# Patient Record
Sex: Male | Born: 1990 | Race: Black or African American | Hispanic: No | Marital: Single | Smoking: Former smoker
Health system: Southern US, Community
[De-identification: ages and names within clinical notes are randomized; demographics above are authoritative.]

## PROBLEM LIST (undated history)

## (undated) ENCOUNTER — Ambulatory Visit: Admission: EM | Payer: Medicaid Other | Source: Home / Self Care

## (undated) ENCOUNTER — Emergency Department (HOSPITAL_COMMUNITY): Admission: EM | Payer: Self-pay | Source: Home / Self Care

## (undated) ENCOUNTER — Ambulatory Visit

## (undated) DIAGNOSIS — F419 Anxiety disorder, unspecified: Secondary | ICD-10-CM

## (undated) DIAGNOSIS — S82892A Other fracture of left lower leg, initial encounter for closed fracture: Secondary | ICD-10-CM

## (undated) DIAGNOSIS — H538 Other visual disturbances: Secondary | ICD-10-CM

## (undated) DIAGNOSIS — F909 Attention-deficit hyperactivity disorder, unspecified type: Secondary | ICD-10-CM

---
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
## 2008-02-15 ENCOUNTER — Emergency Department (HOSPITAL_COMMUNITY): Admission: EM | Admit: 2008-02-15 | Discharge: 2008-02-15 | Payer: Self-pay | Admitting: Emergency Medicine

## 2008-04-18 ENCOUNTER — Emergency Department (HOSPITAL_COMMUNITY): Admission: EM | Admit: 2008-04-18 | Discharge: 2008-04-18 | Payer: Self-pay | Admitting: Emergency Medicine

## 2008-04-22 ENCOUNTER — Emergency Department (HOSPITAL_COMMUNITY): Admission: EM | Admit: 2008-04-22 | Discharge: 2008-04-22 | Payer: Self-pay | Admitting: Emergency Medicine

## 2008-05-16 ENCOUNTER — Emergency Department (HOSPITAL_COMMUNITY): Admission: EM | Admit: 2008-05-16 | Discharge: 2008-05-16 | Payer: Self-pay | Admitting: Emergency Medicine

## 2008-06-13 ENCOUNTER — Emergency Department (HOSPITAL_COMMUNITY): Admission: EM | Admit: 2008-06-13 | Discharge: 2008-06-13 | Payer: Self-pay | Admitting: Emergency Medicine

## 2008-07-30 ENCOUNTER — Emergency Department (HOSPITAL_COMMUNITY): Admission: EM | Admit: 2008-07-30 | Discharge: 2008-07-31 | Payer: Self-pay | Admitting: Emergency Medicine

## 2008-08-06 ENCOUNTER — Emergency Department (HOSPITAL_COMMUNITY): Admission: EM | Admit: 2008-08-06 | Discharge: 2008-08-06 | Payer: Self-pay | Admitting: Family Medicine

## 2008-08-31 ENCOUNTER — Emergency Department (HOSPITAL_COMMUNITY): Admission: EM | Admit: 2008-08-31 | Discharge: 2008-08-31 | Payer: Self-pay | Admitting: Emergency Medicine

## 2009-01-23 ENCOUNTER — Emergency Department (HOSPITAL_COMMUNITY): Admission: EM | Admit: 2009-01-23 | Discharge: 2009-01-23 | Payer: Self-pay | Admitting: Family Medicine

## 2009-01-31 ENCOUNTER — Emergency Department (HOSPITAL_COMMUNITY): Admission: EM | Admit: 2009-01-31 | Discharge: 2009-01-31 | Payer: Self-pay | Admitting: Family Medicine

## 2009-03-06 ENCOUNTER — Emergency Department (HOSPITAL_COMMUNITY): Admission: EM | Admit: 2009-03-06 | Discharge: 2009-03-06 | Payer: Self-pay | Admitting: Emergency Medicine

## 2009-07-29 ENCOUNTER — Emergency Department (HOSPITAL_COMMUNITY): Admission: EM | Admit: 2009-07-29 | Discharge: 2009-07-30 | Payer: Self-pay | Admitting: Emergency Medicine

## 2011-02-23 ENCOUNTER — Emergency Department (HOSPITAL_COMMUNITY): Payer: Self-pay

## 2011-02-23 ENCOUNTER — Emergency Department (HOSPITAL_COMMUNITY)
Admission: EM | Admit: 2011-02-23 | Discharge: 2011-02-23 | Disposition: A | Discharge status: Home / Self Care | Payer: Self-pay | Attending: Emergency Medicine | Admitting: Emergency Medicine

## 2011-02-23 DIAGNOSIS — R0602 Shortness of breath: Secondary | ICD-10-CM | POA: Insufficient documentation

## 2011-02-23 DIAGNOSIS — R05 Cough: Secondary | ICD-10-CM | POA: Insufficient documentation

## 2011-02-23 DIAGNOSIS — J45901 Unspecified asthma with (acute) exacerbation: Secondary | ICD-10-CM | POA: Insufficient documentation

## 2011-02-23 DIAGNOSIS — R059 Cough, unspecified: Secondary | ICD-10-CM | POA: Insufficient documentation

## 2011-03-18 NOTE — H&P (Signed)
NAMECASSADY, TURANO                ACCOUNT NO.:  [id number]   MEDICAL RECORD NO.:  [id number]          PATIENT TYPE:  INP   LOCATION:  1823                         FACILITY:  [facility]   PHYSICIAN:  Lonia Blood, M.D.DATE OF BIRTH:  11-May-1991   DATE OF ADMISSION:  07/15/2007  DATE OF DISCHARGE:                              HISTORY & PHYSICAL   PRIMARY CARE PHYSICIAN:  Unassigned.   CHIEF COMPLAINT:  Wheezing.   HISTORY OF PRESENT ILLNESS:  Mr. Bexton Haak is a very pleasant 20-year-  old gentleman with a known longstanding history of asthma.  He has not  required previous intubation or acute hospitalization.  He is not on any  disease-modifying medications, as he does not have a primary care  physician.  His difficulty began on approximately July 10, 2007.  He  was working with some chemicals and also in a dusty environment at his  place of employment.  The day after that, he began to experience a  significant sore throat.  By July 12, 2007, his sore throat has  worsened and he began to develop some mild wheezing; as a result, he  presented to the emergency room for evaluation.  He was diagnosed on  July 12, 2007 with bronchitis and given Z-Pak a well as albuterol.  He went home.  His wheezing progressed.  By the morning of July 14, 2007, the patient felt that he was only getting worse.  He presented  back to the emergency room.  At that time, he was given a dose of  prednisone and also told to take 60 mg daily for 4 days.  He was also  given albuterol.  He was sent back home.  He returned later in the day,  however, after he noted that he was having to use his inhaler about  every 1 hour rather than the 4 hours he was told to use it.  In the  emergency room, despite measures that have been administered here, the  patient continues to wheeze.   REVIEW OF SYSTEMS:  Comprehensive review of systems is unremarkable,  except for the positive elements noted in the  history of present illness  above.   PAST MEDICAL HISTORY:  Asthma diagnosed at the age of 4 years.   MEDICATIONS:  1. Albuterol three to four puffs q.4 h. for the last 4 days.  2. Prednisone 60 mg daily for 4 days initiated today.  3. Advair Diskus -- used for approximately 4 days.  4. Z-Pak initiated July 11, 2004.   ALLERGIES:  No known drug allergies.   FAMILY HISTORY:  The patient's mother is alive and healthy.  The  patient's father is also alive and healthy.   SOCIAL HISTORY:  The patient does not smoke, nor has he ever.  He does  not drink alcohol.  He lives in [place].  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   DATA REVIEW:  Chest x-ray reveals hyperinflation.   PHYSICAL EXAMINATION:  VITAL SIGNS:  Temperature 97, blood pressure  120/66, heart rate 127,  respiratory rate 28 and O2 SAT is 96% on room  air.  GENERAL:  Well-developed, well-nourished male in mild respiratory  distress at the present time using accessory muscles to breathe.  LUNGS:  The patient has a prolonged expiratory phase.  There are diffuse  wheezes throughout all fields.  There is poor air movement in all fields  due to the extensive wheezing.  CARDIOVASCULAR:  Tachycardic, but regular rate and rhythm without gallop  or rub.  ABDOMEN:  Thin, soft.  Bowel sounds present.  No hepatosplenomegaly.  No  rebound.  EXTREMITIES:  No significant cyanosis, clubbing or edema in bilateral  lower extremities.   IMPRESSION AND PLAN:  Mr. Tappan is suffering with a severe exacerbation  of his asthma/status asthmaticus.  He has failed oral prednisone and  metered-dose inhaler, albuterol.  His exam now is worrisome, as he  continues to wheeze and has a marked prolonged expiratory phase with  poor air movement.  I will admit the patient to the acute units.  We  will monitor him on telemetry because of the high dose of albuterol that  he will be given.  We will dose him with  high-dose intravenous Solu-  Medrol.  I will place him on an H1 antihistamine and we will follow  serial clinical exams.      Lonia Blood, M.D.  Electronically Signed     JTM/MEDQ  D:  07/15/2007  T:  07/15/2007  Job:  [id number]

## 2011-03-21 NOTE — Discharge Summary (Signed)
NAMELENORRIS, KARGER                ACCOUNT NO.:  [id number]   MEDICAL RECORD NO.:  [id number]           PATIENT TYPE:   LOCATION:                                 FACILITY:   PHYSICIAN:  Hettie Holstein, D.O.    DATE OF BIRTH:  1991/01/21   DATE OF ADMISSION:  DATE OF DISCHARGE:                               DISCHARGE SUMMARY   PRIMARY CARE PHYSICIAN:  He was referred to [facility] and instructed  to follow up within 1-2 weeks.   FINAL DIAGNOSIS:  Asthma exacerbation with slow resolution presenting in  status asthmaticus.   DISPOSITION:  The patient was discharged in medically stable and  improved condition and to follow up with [facility] in 1-2 weeks.   MEDICATIONS ON DISCHARGE:  1. Prednisone taper 40 mg daily x3 days and then taper by 10 mg q. 3      days until complete.  2. Albuterol metered-dose inhaler 2 puffs every 4 hours as needed.   HISTORY OF PRESENT ILLNESS:  For full details please refer to the H&P  dictated by Dr. Jetty Duhamel.  However briefly, Mr. Slemmer is a  pleasant 20 year old male who has a history of asthma longstanding who  has not required prior hospitalization or intubation, on no disease  modifying treatments, and has no primary care physician.  He began  having difficult around 6th September.  He was working in a dusty  environment with some chemicals and he has placed an appointment.  Then  afterwards, he began experiencing significant sore throat and in  September his throat worsened and began to develop some mild wheezing.  He presented to the emergency department.  He was provided with a Z-Pak  and albuterol and sent home.  His symptoms progressively worsened and he  presented to the emergency department.  At that time, he was given  prednisone and told to take 60 mg for 4 days and once again he was sent  home.  He later returned reporting that he felt he required his inhaler  every hour until he once again returned to the emergency department.   He  was examined and found to have significantly impaired breathing and  exhibited diffuse wheezing.  He was admitted for further management.   HOSPITAL COURSE:  He was initiated on IV steroid therapy and frequent  nebulizer treatments.  Eventually, his breathing improved and wheezing  subsided and that he was felt to be suitable for discharge home.  He was  performing peak flow assessments as well and was sent home with a peak  flow meter with instructions.  He is instructed to follow up with  [facility].      Hettie Holstein, D.O.  Electronically Signed     ESS/MEDQ  D:  04/26/2008  T:  04/27/2008  Job:  [id number]

## 2011-04-06 ENCOUNTER — Emergency Department (HOSPITAL_COMMUNITY)
Admission: EM | Admit: 2011-04-06 | Discharge: 2011-04-06 | Disposition: A | Discharge status: Home / Self Care | Payer: Self-pay | Attending: Emergency Medicine | Admitting: Emergency Medicine

## 2011-04-06 DIAGNOSIS — R197 Diarrhea, unspecified: Secondary | ICD-10-CM | POA: Insufficient documentation

## 2011-04-06 DIAGNOSIS — R42 Dizziness and giddiness: Secondary | ICD-10-CM | POA: Insufficient documentation

## 2011-04-06 DIAGNOSIS — R112 Nausea with vomiting, unspecified: Secondary | ICD-10-CM | POA: Insufficient documentation

## 2011-04-06 LAB — POCT I-STAT, CHEM 8
BUN: 19 mg/dL (ref 6–23)
Calcium, Ion: 1.22 mmol/L (ref 1.12–1.32)
Chloride: 102 mEq/L (ref 96–112)
Creatinine, Ser: 1.1 mg/dL (ref 0.4–1.5)
TCO2: 24 mmol/L (ref 0–100)

## 2011-04-07 LAB — OVA AND PARASITE EXAMINATION

## 2011-04-09 LAB — STOOL CULTURE

## 2011-04-26 ENCOUNTER — Emergency Department (HOSPITAL_COMMUNITY): Payer: Self-pay

## 2011-04-26 ENCOUNTER — Emergency Department (HOSPITAL_COMMUNITY)
Admission: EM | Admit: 2011-04-26 | Discharge: 2011-04-26 | Disposition: A | Discharge status: Home / Self Care | Payer: Self-pay | Attending: Emergency Medicine | Admitting: Emergency Medicine

## 2011-04-26 DIAGNOSIS — J45901 Unspecified asthma with (acute) exacerbation: Secondary | ICD-10-CM | POA: Insufficient documentation

## 2011-04-26 DIAGNOSIS — R059 Cough, unspecified: Secondary | ICD-10-CM | POA: Insufficient documentation

## 2011-04-26 DIAGNOSIS — R05 Cough: Secondary | ICD-10-CM | POA: Insufficient documentation

## 2011-04-26 DIAGNOSIS — R0789 Other chest pain: Secondary | ICD-10-CM | POA: Insufficient documentation

## 2011-05-21 ENCOUNTER — Inpatient Hospital Stay (INDEPENDENT_AMBULATORY_CARE_PROVIDER_SITE_OTHER)
Admission: RE | Admit: 2011-05-21 | Discharge: 2011-05-21 | Disposition: A | Discharge status: Home / Self Care | Payer: Self-pay | Source: Ambulatory Visit | Attending: Family Medicine | Admitting: Family Medicine

## 2011-05-21 DIAGNOSIS — K602 Anal fissure, unspecified: Secondary | ICD-10-CM

## 2011-05-21 DIAGNOSIS — J45909 Unspecified asthma, uncomplicated: Secondary | ICD-10-CM

## 2011-05-22 LAB — OCCULT BLOOD, POC DEVICE: Fecal Occult Bld: POSITIVE

## 2011-06-04 ENCOUNTER — Emergency Department (HOSPITAL_COMMUNITY)
Admission: EM | Admit: 2011-06-04 | Discharge: 2011-06-04 | Disposition: A | Discharge status: Home / Self Care | Payer: Self-pay | Attending: Emergency Medicine | Admitting: Emergency Medicine

## 2011-06-04 DIAGNOSIS — J45909 Unspecified asthma, uncomplicated: Secondary | ICD-10-CM | POA: Insufficient documentation

## 2011-08-15 LAB — BASIC METABOLIC PANEL
BUN: 11
CO2: 26
Calcium: 9.9
GFR calc non Af Amer: >60
Glucose, Bld: 119 — ABNORMAL HIGH
Potassium: 4.7

## 2011-08-15 LAB — CBC
HCT: 45.3
Hemoglobin: 15.6
MCHC: 34.6
Platelets: 193
RDW: 13.4

## 2012-02-19 ENCOUNTER — Encounter (HOSPITAL_COMMUNITY): Payer: Self-pay | Admitting: *Deleted

## 2012-02-19 ENCOUNTER — Emergency Department (HOSPITAL_COMMUNITY): Payer: Self-pay

## 2012-02-19 ENCOUNTER — Emergency Department (HOSPITAL_COMMUNITY)
Admission: EM | Admit: 2012-02-19 | Discharge: 2012-02-19 | Disposition: A | Discharge status: Home / Self Care | Payer: Self-pay | Attending: Emergency Medicine | Admitting: Emergency Medicine

## 2012-02-19 DIAGNOSIS — S6980XA Other specified injuries of unspecified wrist, hand and finger(s), initial encounter: Secondary | ICD-10-CM | POA: Insufficient documentation

## 2012-02-19 DIAGNOSIS — W219XXA Striking against or struck by unspecified sports equipment, initial encounter: Secondary | ICD-10-CM | POA: Insufficient documentation

## 2012-02-19 DIAGNOSIS — R609 Edema, unspecified: Secondary | ICD-10-CM | POA: Insufficient documentation

## 2012-02-19 DIAGNOSIS — Y9367 Activity, basketball: Secondary | ICD-10-CM | POA: Insufficient documentation

## 2012-02-19 DIAGNOSIS — H539 Unspecified visual disturbance: Secondary | ICD-10-CM | POA: Insufficient documentation

## 2012-02-19 DIAGNOSIS — M79609 Pain in unspecified limb: Secondary | ICD-10-CM | POA: Insufficient documentation

## 2012-02-19 DIAGNOSIS — M7989 Other specified soft tissue disorders: Secondary | ICD-10-CM | POA: Insufficient documentation

## 2012-02-19 DIAGNOSIS — J45909 Unspecified asthma, uncomplicated: Secondary | ICD-10-CM | POA: Insufficient documentation

## 2012-02-19 DIAGNOSIS — Y9239 Other specified sports and athletic area as the place of occurrence of the external cause: Secondary | ICD-10-CM | POA: Insufficient documentation

## 2012-02-19 DIAGNOSIS — IMO0002 Reserved for concepts with insufficient information to code with codable children: Secondary | ICD-10-CM | POA: Insufficient documentation

## 2012-02-19 DIAGNOSIS — S6990XA Unspecified injury of unspecified wrist, hand and finger(s), initial encounter: Secondary | ICD-10-CM | POA: Insufficient documentation

## 2012-02-19 MED ORDER — NAPROXEN 500 MG PO TABS: 500.0000 mg | ORAL_TABLET | Freq: Two times a day (BID) | ORAL | Status: DC

## 2012-02-19 NOTE — Progress Notes (Signed)
Orthopedic Tech Progress Note Patient Details:  Michael Clayton 1991-06-14 [id number]  Type of Splint: Finger Splint Location: (R) UE Splint Interventions: Application    Jennye Moccasin 02/19/2012, 3:38 PM

## 2012-02-19 NOTE — ED Notes (Signed)
Patient transported to X-ray 

## 2012-02-19 NOTE — ED Notes (Signed)
Pt reports injuring right index finger while playing bball yesterday.

## 2012-02-19 NOTE — Discharge Instructions (Signed)
Important to followup with hand surgery give them a call keep splint in place until seen by them. Recommend elevation of right hand is much as possible take Naprosyn for the swelling. Important to give good range of motion of the finger starting at 2 weeks. Following the sprain to the finger.

## 2012-02-19 NOTE — ED Provider Notes (Signed)
History     CSN: [id number]  Arrival date & time 02/19/12  1409   First MD Initiated Contact with Patient 02/19/12 1424      Chief Complaint  Patient presents with  . Finger Injury    (Consider location/radiation/quality/duration/timing/severity/associated sxs/prior treatment) Patient is a 21 y.o. male presenting with hand pain.  Hand Pain This is a new problem. The current episode started yesterday. The problem has been gradually worsening. Pertinent negatives include no chest pain, no abdominal pain, no headaches and no shortness of breath. The symptoms are aggravated by nothing. The symptoms are relieved by nothing.   Patient while playing basketball yesterday was struck by the basketball to his right index finger. Right index finger was cold towards his mom was dislocated and had to be replaced by him. Following that he had increased swelling at the PIP joint and stiffness. No other injury. No prior injury to the finger.  Current pain is about 6/10 yesterday was an 8/10. Past Medical History  Diagnosis Date  . Asthma     History reviewed. No pertinent past surgical history.  History reviewed. No pertinent family history.  History  Substance Use Topics  . Smoking status: Not on file  . Smokeless tobacco: Not on file  . Alcohol Use: No      Review of Systems  Constitutional: Negative for fever.  HENT: Negative for neck pain.   Eyes: Positive for visual disturbance. Negative for photophobia.  Respiratory: Negative for shortness of breath.   Cardiovascular: Negative for chest pain.  Gastrointestinal: Negative for abdominal pain.  Genitourinary: Negative for dysuria.  Musculoskeletal: Negative for back pain.  Skin: Negative for rash.  Neurological: Negative for headaches.  Hematological: Does not bruise/bleed easily.    Allergies  Review of patient's allergies indicates no known allergies.  Home Medications   Current Outpatient Rx  Name Route Sig Dispense  Refill  . ALBUTEROL SULFATE HFA 108 (90 BASE) MCG/ACT IN AERS Inhalation Inhale 2 puffs into the lungs every 6 (six) hours as needed. For shortness of breath    . NAPROXEN 500 MG PO TABS Oral Take 1 tablet (500 mg total) by mouth 2 (two) times daily. 14 tablet 0    BP 116/76  Pulse 75  Temp(Src) 98 F (36.7 C) (Oral)  Resp 18  SpO2 97%  Physical Exam  Nursing note and vitals reviewed. Constitutional: He is oriented to person, place, and time. He appears well-developed and well-nourished. No distress.  HENT:  Head: Normocephalic and atraumatic.  Eyes: Conjunctivae and EOM are normal. Pupils are equal, round, and reactive to light.  Neck: Normal range of motion. Neck supple.  Cardiovascular: Normal rate, regular rhythm and normal heart sounds.   Pulmonary/Chest: Effort normal and breath sounds normal.  Abdominal: Soft. Bowel sounds are normal.  Musculoskeletal: He exhibits edema and tenderness.       Normal except for right index finger with swelling at the PIP joint still has some range of motion but limited due to the stiffness Refill to the finger is normal sensation is intact no obvious deformity skin is intact.  Neurological: He is oriented to person, place, and time. No cranial nerve deficit. He exhibits normal muscle tone. Coordination normal.  Skin: No rash noted.    ED Course  Procedures (including critical care time)  Labs Reviewed - No data to display Dg Finger Index Right  02/19/2012  *RADIOLOGY REPORT*  Clinical Data: The second digit pain.  RIGHT INDEX FINGER 2+V  Comparison:  None.  Findings: The second proximal interphalangeal joint is held in slight flexion, which may be intentional. A thin curvilinear density is seen along the head of the second proximal phalanx, on the lateral view.  There is focal overlying soft tissue swelling.  IMPRESSION:  Possible tiny capsular avulsion fracture off the head of the second proximal phalanx.  Original Report Authenticated By:  Reyes Ivan, M.D.     1. Closed avulsion fracture of middle or proximal phalanx of finger       MDM   Patient clinically with a dislocation of his right index finger that he replaced himself yesterday x-rays show very questionable proximal avulsion fracture clearly has at least contused finger sprain from the dislocation. Will splint and give referral to hand surgery treat with anti-inflammatories. No other injuries.       Shelda Jakes, MD 02/19/12 1535

## 2012-03-24 ENCOUNTER — Encounter (HOSPITAL_COMMUNITY): Payer: Self-pay | Admitting: *Deleted

## 2012-03-24 ENCOUNTER — Emergency Department (HOSPITAL_COMMUNITY)
Admission: EM | Admit: 2012-03-24 | Discharge: 2012-03-24 | Disposition: A | Discharge status: Home / Self Care | Payer: Self-pay | Attending: Emergency Medicine | Admitting: Emergency Medicine

## 2012-03-24 DIAGNOSIS — R079 Chest pain, unspecified: Secondary | ICD-10-CM | POA: Insufficient documentation

## 2012-03-24 DIAGNOSIS — J45909 Unspecified asthma, uncomplicated: Secondary | ICD-10-CM

## 2012-03-24 DIAGNOSIS — R0602 Shortness of breath: Secondary | ICD-10-CM | POA: Insufficient documentation

## 2012-03-24 MED ORDER — AEROCHAMBER PLUS W/MASK LARGE MISC
1.0000 | Freq: Once | Status: DC
  Filled 2012-03-24 (×2): qty 1

## 2012-03-24 MED ORDER — ALBUTEROL SULFATE (5 MG/ML) 0.5% IN NEBU
5.0000 mg | INHALATION_SOLUTION | Freq: Once | RESPIRATORY_TRACT | Status: AC
  Administered 2012-03-24: 5 mg via RESPIRATORY_TRACT
  Filled 2012-03-24: qty 1

## 2012-03-24 MED ORDER — AMOXICILLIN 500 MG PO CAPS: 500.0000 mg | ORAL_CAPSULE | Freq: Three times a day (TID) | ORAL | Status: AC

## 2012-03-24 MED ORDER — IPRATROPIUM BROMIDE 0.02 % IN SOLN
0.5000 mg | Freq: Once | RESPIRATORY_TRACT | Status: AC
  Administered 2012-03-24: 0.5 mg via RESPIRATORY_TRACT
  Filled 2012-03-24: qty 2.5

## 2012-03-24 MED ORDER — PREDNISONE 20 MG PO TABS: 40.0000 mg | ORAL_TABLET | Freq: Every day | ORAL | Status: AC

## 2012-03-24 MED ORDER — ALBUTEROL SULFATE HFA 108 (90 BASE) MCG/ACT IN AERS: 1.0000 | INHALATION_SPRAY | Freq: Four times a day (QID) | RESPIRATORY_TRACT | Status: DC | PRN

## 2012-03-24 NOTE — ED Notes (Signed)
Patient has had sob and cough for 3 days.  His inhaler is not working.  He reports he has yellow mucous production.

## 2012-03-24 NOTE — ED Provider Notes (Signed)
History    This chart was scribed for Michael Clayton. Oletta Lamas, MD, MD by Smitty Pluck. The patient was seen in room STRE8 and the patient's care was started at 2:28PM.   CSN: [id number]  Arrival date & time 03/24/12  1409   First MD Initiated Contact with Patient 03/24/12 1426      Chief Complaint  Patient presents with  . Shortness of Breath    (Consider location/radiation/quality/duration/timing/severity/associated sxs/prior treatment) Patient is a 21 y.o. male presenting with shortness of breath. The history is provided by the patient.  Shortness of Breath  Associated symptoms include chest pain, cough, shortness of breath and wheezing. Pertinent negatives include no fever.   OGDEN HANDLIN is a 21 y.o. male who presents to the Emergency Department complaining of moderate SOB and persistent productive cough with yellow sputum onset 3 days ago. Pt reports that he has tried albuterol without relief. Mild back pain that is aggravated by cough.  Denies chills and fevers. Symptoms have been constant since onset without radiation.   Past Medical History  Diagnosis Date  . Asthma     History reviewed. No pertinent past surgical history.  History reviewed. No pertinent family history.  History  Substance Use Topics  . Smoking status: Not on file  . Smokeless tobacco: Not on file  . Alcohol Use: Yes      Review of Systems  Constitutional: Negative for fever and chills.  Respiratory: Positive for cough, shortness of breath and wheezing. Negative for chest tightness.   Cardiovascular: Positive for chest pain.  Gastrointestinal: Negative for nausea and vomiting.  Genitourinary: Negative for dysuria and flank pain.  Skin: Negative for rash.    Allergies  Review of patient's allergies indicates no known allergies.  Home Medications   Current Outpatient Rx  Name Route Sig Dispense Refill  . ALBUTEROL SULFATE HFA 108 (90 BASE) MCG/ACT IN AERS Inhalation Inhale 2 puffs into the  lungs every 6 (six) hours as needed. For shortness of breath    . ALBUTEROL SULFATE HFA 108 (90 BASE) MCG/ACT IN AERS Inhalation Inhale 1-2 puffs into the lungs every 6 (six) hours as needed for wheezing or shortness of breath. 1 Inhaler 0  . AMOXICILLIN 500 MG PO CAPS Oral Take 1 capsule (500 mg total) by mouth 3 (three) times daily. 21 capsule 0  . PREDNISONE 20 MG PO TABS Oral Take 2 tablets (40 mg total) by mouth daily. 12 tablet 0    BP 113/81  Pulse 50  Temp(Src) 97.9 F (36.6 C) (Oral)  Resp 18  Ht 6' (1.829 m)  Wt 170 lb (77.111 kg)  BMI 23.06 kg/m2  SpO2 94%  Physical Exam  Nursing note and vitals reviewed. Constitutional: He is oriented to person, place, and time. He appears well-developed and well-nourished. No distress.  HENT:  Head: Normocephalic and atraumatic.  Eyes: Conjunctivae are normal. Pupils are equal, round, and reactive to light.  Neck: Normal range of motion. Neck supple.  Cardiovascular: Normal rate and regular rhythm.   Pulmonary/Chest: Effort normal. No accessory muscle usage. Not tachypneic. No respiratory distress. He has wheezes (worse on right) in the right middle field, the right lower field and the left lower field. He has no rhonchi. He has no rales. He exhibits tenderness.  Abdominal: Soft. He exhibits no distension. There is no tenderness.  Neurological: He is alert and oriented to person, place, and time.  Skin: Skin is warm and dry. No rash noted.  Psychiatric: He has a  normal mood and affect. His behavior is normal.    ED Course  Procedures (including critical care time) DIAGNOSTIC STUDIES: Oxygen Saturation is 94% on room air, adequate by my interpretation.    COORDINATION OF CARE: 2:31PM EDP discusses pt ED treatment with pt. Orders medication: albuterol 0.5% 5 mg, Atrovent 0.5 mg   Labs Reviewed - No data to display No results found.   1. Asthmatic bronchitis       MDM  I personally performed the services described in this  documentation, which was scribed in my presence. The recorded information has been reviewed and considered.    Pt in no sig respi distress.  With change in sputum, productive with occasional mild left side CP, will put on amoxicillin.  Pt was concerned about cost compared to Z pak.  Pt can follow up at urgent care or with PCP in 1 week if not improving . Rx for a albuterol inhaler refill as well as prednisone and amox.  Spacer and neb provided here.          Michael Clayton. Frazier Balfour, MD 03/24/12 1438

## 2012-04-30 ENCOUNTER — Emergency Department (HOSPITAL_COMMUNITY)
Admission: EM | Admit: 2012-04-30 | Discharge: 2012-04-30 | Disposition: A | Discharge status: Home / Self Care | Payer: Self-pay | Attending: Emergency Medicine | Admitting: Emergency Medicine

## 2012-04-30 DIAGNOSIS — R0609 Other forms of dyspnea: Secondary | ICD-10-CM | POA: Insufficient documentation

## 2012-04-30 DIAGNOSIS — R0989 Other specified symptoms and signs involving the circulatory and respiratory systems: Secondary | ICD-10-CM | POA: Insufficient documentation

## 2012-11-03 HISTORY — PX: EYE SURGERY: SHX253

## 2013-07-14 DIAGNOSIS — H33029 Retinal detachment with multiple breaks, unspecified eye: Secondary | ICD-10-CM | POA: Insufficient documentation

## 2013-07-14 DIAGNOSIS — H33321 Round hole, right eye: Secondary | ICD-10-CM | POA: Insufficient documentation

## 2014-04-06 ENCOUNTER — Encounter (HOSPITAL_COMMUNITY): Payer: Self-pay | Admitting: Emergency Medicine

## 2014-04-06 ENCOUNTER — Emergency Department (HOSPITAL_COMMUNITY)
Admission: EM | Admit: 2014-04-06 | Discharge: 2014-04-06 | Disposition: A | Discharge status: Home / Self Care | Payer: Self-pay | Attending: Emergency Medicine | Admitting: Emergency Medicine

## 2014-04-06 DIAGNOSIS — IMO0002 Reserved for concepts with insufficient information to code with codable children: Secondary | ICD-10-CM | POA: Insufficient documentation

## 2014-04-06 DIAGNOSIS — J45901 Unspecified asthma with (acute) exacerbation: Secondary | ICD-10-CM | POA: Insufficient documentation

## 2014-04-06 DIAGNOSIS — Z79899 Other long term (current) drug therapy: Secondary | ICD-10-CM | POA: Insufficient documentation

## 2014-04-06 MED ORDER — PREDNISONE 20 MG PO TABS: 60.0000 mg | ORAL_TABLET | Freq: Once | ORAL | Status: DC

## 2014-04-06 MED ORDER — IPRATROPIUM BROMIDE 0.02 % IN SOLN
0.5000 mg | Freq: Once | RESPIRATORY_TRACT | Status: AC
  Administered 2014-04-06: 0.5 mg via RESPIRATORY_TRACT
  Filled 2014-04-06: qty 2.5

## 2014-04-06 MED ORDER — PREDNISONE 20 MG PO TABS: ORAL_TABLET | ORAL | Status: DC

## 2014-04-06 MED ORDER — PREDNISONE 20 MG PO TABS
60.0000 mg | ORAL_TABLET | Freq: Once | ORAL | Status: AC
  Administered 2014-04-06: 60 mg via ORAL
  Filled 2014-04-06: qty 3

## 2014-04-06 MED ORDER — ALBUTEROL SULFATE (2.5 MG/3ML) 0.083% IN NEBU
5.0000 mg | INHALATION_SOLUTION | Freq: Once | RESPIRATORY_TRACT | Status: AC
  Administered 2014-04-06: 5 mg via RESPIRATORY_TRACT
  Filled 2014-04-06: qty 6

## 2014-04-06 NOTE — ED Notes (Signed)
When telling pt about discharge, he requested that someone look at right foot--- has "abrasion" per patient to sole of foot-- open areas to bottom of right foot, states has been weeping from wound.

## 2014-04-06 NOTE — Discharge Instructions (Signed)
Take prednisone as prescribed beginning tomorrow as you were given the first dose in the emergency department today. Use your inhaler every 4-6 hours as needed for wheezing.  Asthma Attack Prevention Although there is no way to prevent asthma from starting, you can take steps to control the disease and reduce its symptoms. Learn about your asthma and how to control it. Take an active role to control your asthma by working with your health care provider to create and follow an asthma action plan. An asthma action plan guides you in:  Taking your medicines properly.  Avoiding things that set off your asthma or make your asthma worse (asthma triggers).  Tracking your level of asthma control.  Responding to worsening asthma.  Seeking emergency care when needed. To track your asthma, keep records of your symptoms, check your peak flow number using a handheld device that shows how well air moves out of your lungs (peak flow meter), and get regular asthma checkups.  WHAT ARE SOME WAYS TO PREVENT AN ASTHMA ATTACK?  Take medicines as directed by your health care provider.  Keep track of your asthma symptoms and level of control.  With your health care provider, write a detailed plan for taking medicines and managing an asthma attack. Then be sure to follow your action plan. Asthma is an ongoing condition that needs regular monitoring and treatment.  Identify and avoid asthma triggers. Many outdoor allergens and irritants (such as pollen, mold, cold air, and air pollution) can trigger asthma attacks. Find out what your asthma triggers are and take steps to avoid them.  Monitor your breathing. Learn to recognize warning signs of an attack, such as coughing, wheezing, or shortness of breath. Your lung function may decrease before you notice any signs or symptoms, so regularly measure and record your peak airflow with a home peak flow meter.  Identify and treat attacks early. If you act quickly, you  are less likely to have a severe attack. You will also need less medicine to control your symptoms. When your peak flow measurements decrease and alert you to an upcoming attack, take your medicine as instructed and immediately stop any activity that may have triggered the attack. If your symptoms do not improve, get medical help.  Pay attention to increasing quick-relief inhaler use. If you find yourself relying on your quick-relief inhaler, your asthma is not under control. See your health care provider about adjusting your treatment. WHAT CAN MAKE MY SYMPTOMS WORSE? A number of common things can set off or make your asthma symptoms worse and cause temporary increased inflammation of your airways. Keep track of your asthma symptoms for several weeks, detailing all the environmental and emotional factors that are linked with your asthma. When you have an asthma attack, go back to your asthma diary to see which factor, or combination of factors, might have contributed to it. Once you know what these factors are, you can take steps to control many of them. If you have allergies and asthma, it is important to take asthma prevention steps at home. Minimizing contact with the substance to which you are allergic will help prevent an asthma attack. Some triggers and ways to avoid these triggers are: Animal Dander:  Some people are allergic to the flakes of skin or dried saliva from animals with fur or feathers.   There is no such thing as a hypoallergenic dog or cat breed. All dogs or cats can cause allergies, even if they don't shed.  Keep these  pets out of your home.  If you are not able to keep a pet outdoors, keep the pet out of your bedroom and other sleeping areas at all times, and keep the door closed.  Remove carpets and furniture covered with cloth from your home. If that is not possible, keep the pet away from fabric-covered furniture and carpets. Dust Mites: Many people with asthma are allergic  to dust mites. Dust mites are tiny bugs that are found in every home in mattresses, pillows, carpets, fabric-covered furniture, bedcovers, clothes, stuffed toys, and other fabric-covered items.   Cover your mattress in a special dust-proof cover.  Cover your pillow in a special dust-proof cover, or wash the pillow each week in hot water. Water must be hotter than 130 F (54.4 C) to kill dust mites. Cold or warm water used with detergent and bleach can also be effective.  Wash the sheets and blankets on your bed each week in hot water.  Try not to sleep or lie on cloth-covered cushions.  Call ahead when traveling and ask for a smoke-free hotel room. Bring your own bedding and pillows in case the hotel only supplies feather pillows and down comforters, which may contain dust mites and cause asthma symptoms.  Remove carpets from your bedroom and those laid on concrete, if you can.  Keep stuffed toys out of the bed, or wash the toys weekly in hot water or cooler water with detergent and bleach. Cockroaches: Many people with asthma are allergic to the droppings and remains of cockroaches.   Keep food and garbage in closed containers. Never leave food out.  Use poison baits, traps, powders, gels, or paste (for example, boric acid).  If a spray is used to kill cockroaches, stay out of the room until the odor goes away. Indoor Mold:  Fix leaky faucets, pipes, or other sources of water that have mold around them.  Clean floors and moldy surfaces with a fungicide or diluted bleach.  Avoid using humidifiers, vaporizers, or swamp coolers. These can spread molds through the air. Pollen and Outdoor Mold:  When pollen or mold spore counts are high, try to keep your windows closed.  Stay indoors with windows closed from late morning to afternoon. Pollen and some mold spore counts are highest at that time.  Ask your health care provider whether you need to take anti-inflammatory medicine or  increase your dose of the medicine before your allergy season starts. Other Irritants to Avoid:  Tobacco smoke is an irritant. If you smoke, ask your health care provider how you can quit. Ask family members to quit smoking too. Do not allow smoking in your home or car.  If possible, do not use a wood-burning stove, kerosene heater, or fireplace. Minimize exposure to all sources of smoke, including to incense, candles, fires, and fireworks.  Try to stay away from strong odors and sprays, such as perfume, talcum powder, hair spray, and paints.  Decrease humidity in your home and use an indoor air cleaning device. Reduce indoor humidity to below 60%. Dehumidifiers or central air conditioners can do this.  Decrease house dust exposure by changing furnace and air cooler filters frequently.  Try to have someone else vacuum for you once or twice a week. Stay out of rooms while they are being vacuumed and for a short while afterward.  If you vacuum, use a dust mask from a hardware store, a double-layered or microfilter vacuum cleaner bag, or a vacuum cleaner with a HEPA filter.  Sulfites in foods and beverages can be irritants. Do not drink beer or wine or eat dried fruit, processed potatoes, or shrimp if they cause asthma symptoms.  Cold air can trigger an asthma attack. Cover your nose and mouth with a scarf on cold or windy days.  Several health conditions can make asthma more difficult to manage, including a runny nose, sinus infections, reflux disease, psychological stress, and sleep apnea. Work with your health care provider to manage these conditions.  Avoid close contact with people who have a respiratory infection such as a cold or the flu, since your asthma symptoms may get worse if you catch the infection. Wash your hands thoroughly after touching items that may have been handled by people with a respiratory infection.  Get a flu shot every year to protect against the flu virus, which  often makes asthma worse for days or weeks. Also get a pneumonia shot if you have not previously had one. Unlike the flu shot, the pneumonia shot does not need to be given yearly. Medicines:  Talk to your health care provider about whether it is safe for you to take aspirin or non-steroidal anti-inflammatory medicines (NSAIDs). In a small number of people with asthma, aspirin and NSAIDs can cause asthma attacks. These medicines must be avoided by people who have known aspirin-sensitive asthma. It is important that people with aspirin-sensitive asthma read labels of all over-the-counter medicines used to treat pain, colds, coughs, and fever.  Beta blockers and ACE inhibitors are other medicines you should discuss with your health care provider. HOW CAN I FIND OUT WHAT I AM ALLERGIC TO? Ask your asthma health care provider about allergy skin testing or blood testing (the RAST test) to identify the allergens to which you are sensitive. If you are found to have allergies, the most important thing to do is to try to avoid exposure to any allergens that you are sensitive to as much as possible. Other treatments for allergies, such as medicines and allergy shots (immunotherapy) are available.  CAN I EXERCISE? Follow your health care provider's advice regarding asthma treatment before exercising. It is important to maintain a regular exercise program, but vigorous exercise, or exercise in cold, humid, or dry environments can cause asthma attacks, especially for those people who have exercise-induced asthma. Document Released: 10/08/2009 Document Revised: 06/22/2013 Document Reviewed: 04/27/2013 [facility] Patient Information 2014 [place], [place].  Asthma, Acute Bronchospasm Acute bronchospasm caused by asthma is also referred to as an asthma attack. Bronchospasm means your air passages become narrowed. The narrowing is caused by inflammation and tightening of the muscles in the air tubes (bronchi) in your lungs.  This can make it hard to breath or cause you to wheeze and cough. CAUSES Possible triggers are:  Animal dander from the skin, hair, or feathers of animals.  Dust mites contained in house dust.  Cockroaches.  Pollen from trees or grass.  Mold.  Cigarette or tobacco smoke.  Air pollutants such as dust, household cleaners, hair sprays, aerosol sprays, paint fumes, strong chemicals, or strong odors.  Cold air or weather changes. Cold air may trigger inflammation. Winds increase molds and pollens in the air.  Strong emotions such as crying or laughing hard.  Stress.  Certain medicines such as aspirin or beta-blockers.  Sulfites in foods and drinks, such as dried fruits and wine.  Infections or inflammatory conditions, such as a flu, cold, or inflammation of the nasal membranes (rhinitis).  Gastroesophageal reflux disease (GERD). GERD is a condition where  stomach acid backs up into your throat (esophagus).  Exercise or strenuous activity. SIGNS AND SYMPTOMS   Wheezing.  Excessive coughing, particularly at night.  Chest tightness.  Shortness of breath. DIAGNOSIS  Your health care provider will ask you about your medical history and perform a physical exam. A chest X-ray or blood testing may be performed to look for other causes of your symptoms or other conditions that may have triggered your asthma attack. TREATMENT  Treatment is aimed at reducing inflammation and opening up the airways in your lungs. Most asthma attacks are treated with inhaled medicines. These include quick relief or rescue medicines (such as bronchodilators) and controller medicines (such as inhaled corticosteroids). These medicines are sometimes given through an inhaler or a nebulizer. Systemic steroid medicine taken by mouth or given through an IV tube also can be used to reduce the inflammation when an attack is moderate or severe. Antibiotic medicines are only used if a bacterial infection is present.    HOME CARE INSTRUCTIONS   Rest.  Drink plenty of liquids. This helps the mucus to remain thin and be easily coughed up. Only use caffeine in moderation and do not use alcohol until you have recovered from your illness.  Do not smoke. Avoid being exposed to secondhand smoke.  You play a critical role in keeping yourself in good health. Avoid exposure to things that cause you to wheeze or to have breathing problems.  Keep your medicines up to date and available. Carefully follow your health care provider's treatment plan.  Take your medicine exactly as prescribed.  When pollen or pollution is bad, keep windows closed and use an air conditioner or go to places with air conditioning.  Asthma requires careful medical care. See your health care provider for a follow-up as advised. If you are more than [redacted] weeks pregnant and you were prescribed any new medicines, let your obstetrician know about the visit and how you are doing. Follow-up with your health care provider as directed.  After you have recovered from your asthma attack, make an appointment with your outpatient doctor to talk about ways to reduce the likelihood of future attacks. If you do not have a doctor who manages your asthma, make an appointment with a primary care doctor to discuss your asthma. SEEK IMMEDIATE MEDICAL CARE IF:   You are getting worse.  You have trouble breathing. If severe, call your local emergency services (911 in the [place]  You develop chest pain or discomfort.  You are vomiting.  You are not able to keep fluids down.  You are coughing up yellow, green, brown, or bloody sputum.  You have a fever and your symptoms suddenly get worse.  You have trouble swallowing. MAKE SURE YOU:   Understand these instructions.  Will watch your condition.  Will get help right away if you are not doing well or get worse. Document Released: 02/04/2007 Document Revised: 06/22/2013 Document Reviewed:  04/27/2013 [facility] Patient Information 2014 [place]

## 2014-04-06 NOTE — ED Notes (Signed)
To Ed via private vehicle with c/o increased shortness of breath-- hx of asthma-- has had to use inhaler more than normal.

## 2014-04-06 NOTE — ED Provider Notes (Signed)
CSN: [id number]     Arrival date & time 04/06/14  0919 History   First MD Initiated Contact with Patient 04/06/14 [phone]     Chief Complaint  Patient presents with  . Asthma     (Consider location/radiation/quality/duration/timing/severity/associated sxs/prior Treatment) HPI Comments: 23 year old male with a past medical history of asthma presents to the emergency department awaiting of an asthma exacerbation x2 days. Patient states he is feeling short of breath, wheezing and chest tightness. He has tried using his albuterol inhaler with no relief. He does not take any other medications for his asthma. Denies cough, fever or, chills or chest pain. States he has not had an asthma exacerbation like this for over a year. He only had to be admitted to the hospital for an asthma exacerbation when he was a young child. No history of intubation.  Patient is a 23 y.o. male presenting with asthma. The history is provided by the patient.  Asthma    Past Medical History  Diagnosis Date  . Asthma    History reviewed. No pertinent past surgical history. History reviewed. No pertinent family history. History  Substance Use Topics  . Smoking status: Never Smoker   . Smokeless tobacco: Not on file  . Alcohol Use: Yes    Review of Systems  Respiratory: Positive for chest tightness, shortness of breath and wheezing.   All other systems reviewed and are negative.     Allergies  Review of patient's allergies indicates no known allergies.  Home Medications   Prior to Admission medications   Medication Sig Start Date End Date Taking? Authorizing Provider  albuterol (PROVENTIL HFA;VENTOLIN HFA) 108 (90 BASE) MCG/ACT inhaler Inhale 2 puffs into the lungs daily as needed for wheezing or shortness of breath. For shortness of breath   Yes Historical Provider, MD  Ephedrine-Guaifenesin (PRIMATENE ASTHMA) 12.5-200 MG TABS Take 1 tablet by mouth daily as needed (for asthma).   Yes Historical Provider, MD   albuterol (PROVENTIL HFA;VENTOLIN HFA) 108 (90 BASE) MCG/ACT inhaler Inhale 1-2 puffs into the lungs every 6 (six) hours as needed for wheezing or shortness of breath. 03/24/12 03/24/13  Gavin Pound. Ghim, MD  predniSONE (DELTASONE) 20 MG tablet 2 tabs po daily x 3 days 04/06/14   Trevor Mace, PA-C   BP 133/83  Temp(Src) 97.5 F (36.4 C) (Oral)  Resp 22  SpO2 96% Physical Exam  Nursing note and vitals reviewed. Constitutional: He is oriented to person, place, and time. He appears well-developed and well-nourished. No distress.  HENT:  Head: Normocephalic and atraumatic.  Mouth/Throat: Oropharynx is clear and moist.  Eyes: Conjunctivae are normal.  Neck: Normal range of motion. Neck supple. No tracheal deviation present.  Cardiovascular: Normal rate, regular rhythm and normal heart sounds.   Pulmonary/Chest: Effort normal.  Poor air movement. Scattered inspiratory and expiratory wheezes bilateral.  Musculoskeletal: Normal range of motion. He exhibits no edema.  Neurological: He is alert and oriented to person, place, and time.  Skin: Skin is warm and dry. He is not diaphoretic.  Psychiatric: He has a normal mood and affect. His behavior is normal.    ED Course  Procedures (including critical care time) Labs Review Labs Reviewed - No data to display  Imaging Review No results found.   EKG Interpretation None      MDM   Final diagnoses:  Asthma exacerbation   Patient presenting with asthma exacerbation. No respiratory distress. He is well appearing and in no apparent distress. Vital signs stable. O2  sat 95% on room air. Poor air movement and scattered wheezes noted on exam. Plan to give oral prednisone, duo neb and reassess. 10:42 AM Patient reports great improvement of symptoms after doing that. On repeat exam, air movement greatly improved, wheezing significantly subsided. Stable for discharge. Will discharge with short burst of prednisone. He has an albuterol inhaler at  home. Return precautions given. Patient states understanding of treatment care plan and is agreeable.   Trevor MaceRobyn M Albert, PA-C 04/06/14 1043

## 2014-04-06 NOTE — ED Provider Notes (Signed)
Medical screening examination/treatment/procedure(s) were performed by non-physician practitioner and as supervising physician I was immediately available for consultation/collaboration.   EKG Interpretation None        Traycen Goyer N Nikola Blackston, DO 04/06/14 1545 

## 2014-04-10 NOTE — Discharge Planning (Signed)
[facility] Community Liaison was not able to see the pt, [facility] orange card information will be sent to the address listed. °

## 2014-04-18 ENCOUNTER — Emergency Department (HOSPITAL_COMMUNITY)
Admission: EM | Admit: 2014-04-18 | Discharge: 2014-04-19 | Disposition: A | Discharge status: Home / Self Care | Payer: Self-pay | Attending: Emergency Medicine | Admitting: Emergency Medicine

## 2014-04-18 ENCOUNTER — Emergency Department (HOSPITAL_COMMUNITY): Payer: Self-pay

## 2014-04-18 ENCOUNTER — Encounter (HOSPITAL_COMMUNITY): Payer: Self-pay | Admitting: Emergency Medicine

## 2014-04-18 DIAGNOSIS — Z79899 Other long term (current) drug therapy: Secondary | ICD-10-CM | POA: Insufficient documentation

## 2014-04-18 DIAGNOSIS — J45901 Unspecified asthma with (acute) exacerbation: Secondary | ICD-10-CM | POA: Insufficient documentation

## 2014-04-18 MED ORDER — ALBUTEROL SULFATE HFA 108 (90 BASE) MCG/ACT IN AERS
2.0000 | INHALATION_SPRAY | RESPIRATORY_TRACT | Status: AC
  Administered 2014-04-18: 2 via RESPIRATORY_TRACT
  Filled 2014-04-18: qty 6.7

## 2014-04-18 MED ORDER — IPRATROPIUM-ALBUTEROL 0.5-2.5 (3) MG/3ML IN SOLN
3.0000 mL | Freq: Once | RESPIRATORY_TRACT | Status: AC
  Administered 2014-04-18: 3 mL via RESPIRATORY_TRACT
  Filled 2014-04-18: qty 3

## 2014-04-18 MED ORDER — ALBUTEROL SULFATE (2.5 MG/3ML) 0.083% IN NEBU
2.5000 mg | INHALATION_SOLUTION | Freq: Once | RESPIRATORY_TRACT | Status: AC
  Administered 2014-04-18: 2.5 mg via RESPIRATORY_TRACT
  Filled 2014-04-18: qty 3

## 2014-04-18 MED ORDER — PREDNISONE 20 MG PO TABS
40.0000 mg | ORAL_TABLET | Freq: Every day | ORAL | Status: DC
Start: 2014-04-18 — End: 2014-05-17

## 2014-04-18 MED ORDER — IPRATROPIUM-ALBUTEROL 0.5-2.5 (3) MG/3ML IN SOLN
3.0000 mL | Freq: Once | RESPIRATORY_TRACT | Status: AC
Start: 2014-04-18 — End: 2014-04-18
  Administered 2014-04-18: 3 mL via RESPIRATORY_TRACT
  Filled 2014-04-18: qty 3

## 2014-04-18 MED ORDER — PREDNISONE 20 MG PO TABS
60.0000 mg | ORAL_TABLET | Freq: Once | ORAL | Status: AC
  Administered 2014-04-18: 60 mg via ORAL
  Filled 2014-04-18: qty 3

## 2014-04-18 NOTE — Progress Notes (Signed)
  CARE MANAGEMENT ED NOTE 04/18/2014  Patient:  Michael Clayton,Michael Clayton   Account Number:  [id number]  Date Initiated:  04/18/2014  Documentation initiated by:  Radford PaxFERRERO,AMY  Subjective/Objective Assessment:   Patient presents to ED with productive cough for yellow sputum for one week     Subjective/Objective Assessment Detail:   Patient with pmhx of asthma.     Action/Plan:   Action/Plan Detail:   Anticipated DC Date:       Status Recommendation to Physician:   Result of Recommendation:    Other ED Services  Consult Working Plan    DC Planning Services  Other  PCP issues    Choice offered to / List presented to:            Status of service:  Completed, signed off  ED Comments:   ED Comments Detail:  EDCM spoke to patient at bedside.  Patient confirms he does not have a pcp [place] [facility] with a list of [facility] who accept self pay patients, [facility], informed patient that walk [facility] welcom [facility]- Thurs from 9am -1030am,  list of discounted paharmacies, websites needymeds.org and Good https://figueroa.info/X.com for medication assistance, financial resources in the community such as local churches and [employer], urban ministries, phone number to inquire about the orange card and where to enroll, phone number to inquire about the [employer]ordable Care Act and Medicaid for insurance, and dental asistance for uninsured patients.  Patient thankful for resources.  No further EDCM needs at this time.

## 2014-04-18 NOTE — Discharge Instructions (Signed)
°Emergency Department Resource Guide °1) Find a Doctor and Pay Out of Pocket °Although you won't have to find out who is covered by your insurance plan, it is a good idea to ask around and get recommendations. You will then need to call the office and see if the doctor you have chosen will accept you as a new patient and what types of options they offer for patients who are self-pay. Some doctors offer discounts or will set up payment plans for their patients who do not have insurance, but you will need to ask so you aren't surprised when you get to your appointment. ° °2) Contact Your Local Health Department °Not all health departments have doctors that can see patients for sick visits, but many do, so it is worth a call to see if yours does. If you don't know where your local health department is, you can check in your phone book. The CDC also has a tool to help you locate your state's health department, and many state websites also have listings of all of their local health departments. ° °3) Find a Walk-in Clinic °If your illness is not likely to be very severe or complicated, you may want to try a walk in clinic. These are popping up all over the country in pharmacies, drugstores, and shopping centers. They're usually staffed by nurse practitioners or physician assistants that have been trained to treat common illnesses and complaints. They're usually fairly quick and inexpensive. However, if you have serious medical issues or chronic medical problems, these are probably not your best option. ° °No Primary Care Doctor: °- Call [facility] at  832-8000 - they can help you locate a primary care doctor that  accepts your insurance, provides certain services, etc. °- Physician Referral Service- 1-800-533-3463 ° °Chronic Pain Problems: °Organization         Address  Phone   Notes  °[facility]  (336) 297-2271 Patients need to be referred by their primary care doctor.  ° °Medication  Assistance: °Organization         Address  Phone   Notes  °[place] [facility] [address] °[place], [place] [address] (336) 641-8030 --Must be a resident of [place] °-- Must have NO insurance coverage whatsoever (no Medicaid/ Medicare, etc.) °-- The pt. MUST have a primary care doctor that directs their care regularly and follows them in the community °  °[facility]  (866) 331-1348   °[employer]  (888) 892-1162   ° °Agencies that provide inexpensive medical care: °Organization         Address  Phone   Notes  °[facility]  (336) 832-8035   °[facility]    (336) 832-7272   °[facility] [address] °[place], [place] [address] (336) 832-4777   °[facility] [address], °[place] (336) 271-4999   °[facility]    (336) 373-0678   °[facility]    (336) 272-1050   °[facility] ° [address], [place] Phone:  (336) 832-4444, Fax:  (336) 832-4440 Hours of Operation:  9 am - 6 pm, M-F.  Also accepts Medicaid/Medicare and self-pay.  °[facility] ° [address], [place] Phone: (336) 832-3150, Fax: (336) 832-3151. Hours of Operation:  8:30 am - 5:30 pm, M-F.  Also accepts Medicaid and self-pay.  °[facility] [place] [address], [place] Phone: (336) 878-6027   °[facility] [address], [place], [place] (336)723-1848, Ext. 123 Mondays & Thursdays: 7-9 AM.  First 15 patients are seen on a first come, first serve basis. °  ° °Medicaid-accepting [place] Providers: ° °Organization         Address  Phone   Notes  °[facility] [address], [place] (336) 641-2100 Also accepts self-pay patients.  °[facility] [address], [place] ° (336) 856-9996   °[facility] [address], [place]  (336) 288-8857   °[facility] [address] [place] [address], [place] (336) 299-7000   °Veita Bland [address], [place]  ° (336) 373-1557 Only accepts [place] Access Medicaid patients after they have their name applied to their card.  ° °Self-Pay (no insurance) in [place]: ° °Organization         Address  Phone   Notes  °Sickle Cell Patients, [facility] [address], [place] (336) 832-1970   °[facility] [address], [place] (336) 832-4400   °[facility] ° [address], [place] (336) 992-4800   °[facility]/Dr. Osei-Bonsu ° [address] [place] [address], [place] or [address], [place] (336) 841-8500 Phone number for both [place] and [place] locations is the same.  °[facility] [address], [place] (336) 299-0000   °[facility] [address] [place] [address], [place] or [address] (336) 852-7530 °(336) 878-2260   °[facility] [address], [place] (336) 350-1642, phone; (336) 294-5005, fax Sees patients 1st and 3rd Saturday of every month.  Must not qualify for public or private insurance (i.e. Medicaid, Medicare, [place] Health Choice, Veterans' Benefits) • Household income should be no more than 200% of the poverty level •The clinic cannot treat you if you are pregnant or think you are pregnant • Sexually transmitted diseases are not treated at the clinic.  ° ° °Dental Care: °Organization         Address  Phone  Notes  °[facility] [facility] [place] [facility] [address], [place] (336) 641-6152 Accepts children up to age 21 who are enrolled in Medicaid or [place] Health Choice; pregnant women with a Medicaid card; and children who have applied for Medicaid or [place] Health Choice, but were declined, whose parents can pay a reduced fee at time of service.  °[place]  [facility] [place]  [address], [place] (336) 641-7733 Accepts children up to age 21 who are enrolled in Medicaid or [place] Health Choice; pregnant women with a Medicaid card; and children who have applied for Medicaid or [place] Health Choice, but were declined, whose parents can pay a reduced fee at time of service.  °[facility] ° [address],  (336) 641-4533 Patients are seen by appointment only. Walk-ins are not accepted. [facility] will see patients 18 years of age and older. °Monday - Tuesday (8am-5pm) °Most Wednesdays (8:30-5pm) °$30 per visit, cash only  °[facility] ° [address], [place] (336) 641-4533 Patients are seen by appointment only. Walk-ins are not accepted. [facility] will see patients 18 years of age and older. °One   Wednesday Evening (Monthly: Volunteer Based).  $30 per visit, cash only  °[facility]  (919) 537-3737 for adults; Children under age 4, call Graduate Pediatric Dentistry at (919) [phone]. Children aged 4-14, please call (919) 537-3737 to request a pediatric application. ° Dental services are provided in all areas of dental care including fillings, crowns and bridges, complete and partial dentures, implants, gum treatment, root canals, and extractions. Preventive care is also provided. Treatment is provided to both adults and children. °Patients are selected via a lottery and there is often a waiting list. °  °[facility] [address], °[place] ° (336) 763-8833 [email or web address].[email or web address] °  °[facility] [address], [place], [place] (336)723-1848, Ext. 123 Second and Fourth Thursday of each month, opens at 6:30 AM; Clinic ends at 9 AM.  Patients are seen on a first-come first-served basis, and a limited number are seen during each clinic.  ° °[facility] ° [address], [place], [place] (336) 723-7904    Eligibility Requirements °You must have lived in [place], [place], or [place] counties for at least the last three months. °  You cannot be eligible for state or federal sponsored healthcare insurance, including Veterans Administration, Medicaid, or Medicare. °  You generally cannot be eligible for healthcare insurance through your employer.  °  How to apply: °Eligibility screenings are held every Tuesday and Wednesday afternoon from 1:00 pm until 4:00 pm. You do not need an appointment for the interview!  °[facility] [address], [place], [place] 336-631-2330   °[facility] [facility]  336-342-8273   °[facility]  336-703-3100   °[facility]  336-570-6415   ° °Behavioral Health Resources in the Community: °Intensive Outpatient Programs °Organization         Address  Phone  Notes  °[facility] [address], [place], [place] 336-878-6098   °[facility] [address], [place], [place] 336-832-9800   °[facility] [address], [place], [place] ° 336-882-2125   °[facility] [address],  °[place], [place] 1-800-853-5163 or 336-641-4981   °Substance Abuse Resources °Organization         Address  Phone  Notes  °[facility]  336-882-2125   °[facility]  336-784-9470   °[facility]  336-285-9073   °[facility]  336-845-3988   °Residential & Outpatient Substance Abuse Program  1-800-659-3381   °Psychological Services °Organization         Address  Phone  Notes  °[facility]  336- 832-9600   °[facility]  336- 378-7881   °[facility] [address], [place] 1-800-853-5163 or 336-641-4981   ° °Mobile Crisis Teams °Organization         Address  Phone  Notes  °[facility], Mobile Crisis Care Unit  1-877-626-1772   °Assertive °[facility] ° [address]  , [place] 336-834-9664   °Sharon DeEsch [address] ° [place] 336-554-5454   ° °Self-Help/Support Groups °Organization         Address  Phone             Notes  °[facility]  - variety of support groups  336- 373-1402 Call for more information  °Narcotics Anonymous (NA), [facility] [address], °[place]   2 meetings at this location  ° °  Residential Treatment Programs °Organization         Address  Phone  Notes  °[facility] [address],    °[place]  1-866-801-8205   °[facility] ° [address], [place], [place] 704-293-8524   °[facility] [address], [place] 336-845-3988 Admissions: 8am-3pm M-F  °[facility] [address]    °[place], [place] 336-841-1104   °[facility] [address], [place], [place] 336-379-7146   °[facility] [address]  °[place], [place] 336-285-9073   °[facility] Intensive Outpatient [address], [place], [place] 336-852-3033   °[facility] [address]  °[place], [place] 1-877-615-2722 or 336-784-9470   °[facility] [address] [place], [place] 336-227-7417 Accepts Medicaid  °[facility] [address]  °[place] 1-800-659-3381 Substance Abuse/Addiction Treatment  ° °[place] Behavioral Health Resources °Organization         Address  Phone  Notes  °[facility]  (888) 581-9988   °Julie Brannon, PhD [address] [place], [place]   (336) 349-5553 or (336) 951-0000   °[facility]   [address] °[place], [place] (336) 349-4454   °[facility] [address], [place], [place] (336) 342-8316 Insurance/Medicaid/sponsorship through [facility]  °[facility] [address]                                    [place], [place] (336) 342-8316 Therapy/tele-psych/case    °[facility] [address]  ° [place], [place] (336) 349-2233    °Dr. Arfeen  (336) 349-4544   °[facility] [place]  [employer] [place] [facility] 1) [address], [place] °2) [address], [place] °3)  [address], [place] (336) 349-3220 °(336) 342-7768 ° °(336) 342-8140   °[place] [facility] (336) 342-1394 or (336) 342-3537 (After Hours)    ° ° ° °Take the prescription as directed.  Use your albuterol inhaler (2 to 4 puffs) every 4 hours for the next 7 days, then as needed for cough, wheezing, or shortness of breath.  Call your regular medical doctor tomorrow morning to schedule a follow up appointment within the next 3 days.  Return to the Emergency Department immediately sooner if worsening.  ° °

## 2014-04-18 NOTE — ED Notes (Signed)
Pt c/o productive cough x 1 week with yellow sputum. Pt states he has used all of his MDI this week. Pt states s/s worse at night with orthopnea.

## 2014-04-18 NOTE — ED Provider Notes (Signed)
CSN: [id number]     Arrival date & time 04/18/14  2030 History   First MD Initiated Contact with Patient 04/18/14 2107     Chief Complaint  Patient presents with  . Cough      HPI Pt was seen at 2130.  Per pt, c/o gradual onset and worsening of persistent cough, wheezing and SOB for the past 1 week.  Describes his symptoms as "my asthma is acting up."  Has been using home MDI with transient relief. States he has run out of his MDI. Describes his cough as productive of "yellow" sputum.  Denies CP/palpitations, no back pain, no abd pain, no N/V/D, no fevers, no rash.     Past Medical History  Diagnosis Date  . Asthma    Past Surgical History  Procedure Laterality Date  . Eye surgery Left     History  Substance Use Topics  . Smoking status: Never Smoker   . Smokeless tobacco: Not on file  . Alcohol Use: Yes     Comment: social    Review of Systems ROS: Statement: All systems negative except as marked or noted in the HPI; Constitutional: Negative for fever and chills. ; ; Eyes: Negative for eye pain, redness and discharge. ; ; ENMT: Negative for ear pain, hoarseness, nasal congestion, sinus pressure and sore throat. ; ; Cardiovascular: Negative for chest pain, palpitations, diaphoresis, and peripheral edema. ; ; Respiratory: +cough, wheezing, SOB. Negative for stridor. ; ; Gastrointestinal: Negative for nausea, vomiting, diarrhea, abdominal pain, blood in stool, hematemesis, jaundice and rectal bleeding. . ; ; Genitourinary: Negative for dysuria, flank pain and hematuria. ; ; Musculoskeletal: Negative for back pain and neck pain. Negative for swelling and trauma.; ; Skin: Negative for pruritus, rash, abrasions, blisters, bruising and skin lesion.; ; Neuro: Negative for headache, lightheadedness and neck stiffness. Negative for weakness, altered level of consciousness , altered mental status, extremity weakness, paresthesias, involuntary movement, seizure and syncope.      Allergies   Review of patient's allergies indicates no known allergies.  Home Medications   Prior to Admission medications   Medication Sig Start Date End Date Taking? Authorizing Provider  albuterol (PROVENTIL HFA;VENTOLIN HFA) 108 (90 BASE) MCG/ACT inhaler Inhale 2 puffs into the lungs daily as needed for wheezing or shortness of breath. For shortness of breath   Yes Historical Provider, MD   BP 136/73  Temp(Src) 98.4 F (36.9 C) (Oral)  Resp 17  Ht 6' (1.829 m)  Wt 180 lb (81.647 kg)  BMI 24.41 kg/m2  SpO2 97% Physical Exam 2135: Physical examination:  Nursing notes reviewed; Vital signs and O2 SAT reviewed;  Constitutional: Well developed, Well nourished, Well hydrated, In no acute distress; Head:  Normocephalic, atraumatic; Eyes: EOMI, PERRL, No scleral icterus; ENMT: TM's clear bilat. +edemetous nasal turbinates bilat with clear rhinorrhea. Mouth and pharynx normal, Mucous membranes moist; Neck: Supple, Full range of motion, No lymphadenopathy; Cardiovascular: Regular rate and rhythm, No murmur, rub, or gallop; Respiratory: Breath sounds coarse & equal bilaterally, scattered insp/exp wheezes, no audible wheezing. Speaking full sentences with ease, Normal respiratory effort/excursion; Chest: Nontender, Movement normal; Abdomen: Soft, Nontender, Nondistended, Normal bowel sounds; Genitourinary: No CVA tenderness; Extremities: Pulses normal, No tenderness, No edema, No calf edema or asymmetry.; Neuro: AA&Ox3, Major CN grossly intact.  Speech clear. No gross focal motor or sensory deficits in extremities.; Skin: Color normal, Warm, Dry.   ED Course  Procedures     EKG Interpretation   Date/Time:  Tuesday April 18 2014 20:35:48 EDT Ventricular Rate:  101 PR Interval:  127 QRS Duration: 90 QT Interval:  364 QTC Calculation: 472 R Axis:   95 Text Interpretation:  Sinus tachycardia Borderline right axis deviation  Borderline prolonged QT interval No old tracing to compare Confirmed by   [facility]  MD, Nicholos JohnsKATHLEEN [phone]([id number]) on 04/18/2014 10:03:34 PM      MDM  MDM Reviewed: previous chart, nursing note and vitals Interpretation: x-ray and ECG   Dg Chest 2 View 04/18/2014   CLINICAL DATA:  Chest pain, shortness of breath and cough.  Asthma.  EXAM: CHEST  2 VIEW  COMPARISON:  04/26/2011.  FINDINGS: Stable normal sized heart, clear lungs and mild to moderate lower thoracic scoliosis.  IMPRESSION: No acute abnormality.   Electronically Signed   By: Gordan PaymentSteve  Reid M.D.   On: 04/18/2014 22:09    2300: Pt states he "feels better" after neb and steroid.  NAD, lungs coarse bilat, faint scattered exp wheezes, no audible wheezing, resps easy, speaking full sentences, Sats 97% R/A.  Pt states he wants to go home now, does not want another neb treatment before discharge. Dx and testing d/w pt.  Questions answered.  Verb understanding, agreeable to d/c home with outpt f/u.    Laray AngerKathleen M Kinslea Frances, DO 04/20/14 1727

## 2014-04-18 NOTE — ED Notes (Signed)
Patient transported to X-ray 

## 2014-05-17 ENCOUNTER — Emergency Department (HOSPITAL_COMMUNITY)
Admission: EM | Admit: 2014-05-17 | Discharge: 2014-05-18 | Disposition: A | Discharge status: Home / Self Care | Payer: Self-pay | Attending: Emergency Medicine | Admitting: Emergency Medicine

## 2014-05-17 ENCOUNTER — Emergency Department (HOSPITAL_COMMUNITY): Payer: Self-pay

## 2014-05-17 ENCOUNTER — Encounter (HOSPITAL_COMMUNITY): Payer: Self-pay | Admitting: Emergency Medicine

## 2014-05-17 DIAGNOSIS — Z79899 Other long term (current) drug therapy: Secondary | ICD-10-CM | POA: Insufficient documentation

## 2014-05-17 DIAGNOSIS — J0110 Acute frontal sinusitis, unspecified: Secondary | ICD-10-CM

## 2014-05-17 DIAGNOSIS — H05019 Cellulitis of unspecified orbit: Secondary | ICD-10-CM | POA: Insufficient documentation

## 2014-05-17 DIAGNOSIS — L03213 Periorbital cellulitis: Secondary | ICD-10-CM

## 2014-05-17 DIAGNOSIS — Z9889 Other specified postprocedural states: Secondary | ICD-10-CM | POA: Insufficient documentation

## 2014-05-17 DIAGNOSIS — J45909 Unspecified asthma, uncomplicated: Secondary | ICD-10-CM | POA: Insufficient documentation

## 2014-05-17 LAB — CBC
HCT: 45.3 % (ref 39.0–52.0)
Hemoglobin: 15.9 g/dL (ref 13.0–17.0)
MCH: 30.1 pg (ref 26.0–34.0)
MCHC: 35.1 g/dL (ref 30.0–36.0)
MCV: 85.6 fL (ref 78.0–100.0)
Platelets: 191 10*3/uL (ref 150–400)
RBC: 5.29 MIL/uL (ref 4.22–5.81)
RDW: 12.4 % (ref 11.5–15.5)
WBC: 7.9 10*3/uL (ref 4.0–10.5)

## 2014-05-17 LAB — I-STAT CHEM 8, ED
BUN: 13 mg/dL (ref 6–23)
CALCIUM ION: 1.12 mmol/L (ref 1.12–1.23)
Chloride: 97 mEq/L (ref 96–112)
Creatinine, Ser: 1.1 mg/dL (ref 0.50–1.35)
Glucose, Bld: 74 mg/dL (ref 70–99)
HCT: 49 % (ref 39.0–52.0)
Hemoglobin: 16.7 g/dL (ref 13.0–17.0)
Potassium: 4.6 mEq/L (ref 3.7–5.3)
Sodium: 135 mEq/L — ABNORMAL LOW (ref 137–147)
TCO2: 31 mmol/L (ref 0–100)

## 2014-05-17 MED ORDER — ONDANSETRON HCL 4 MG/2ML IJ SOLN
4.0000 mg | Freq: Once | INTRAMUSCULAR | Status: AC
  Administered 2014-05-17: 4 mg via INTRAVENOUS
  Filled 2014-05-17: qty 2

## 2014-05-17 MED ORDER — MORPHINE SULFATE 4 MG/ML IJ SOLN
4.0000 mg | Freq: Once | INTRAMUSCULAR | Status: AC
  Administered 2014-05-17: 4 mg via INTRAVENOUS
  Filled 2014-05-17: qty 1

## 2014-05-17 MED ORDER — SODIUM CHLORIDE 0.9 % IV SOLN
Freq: Once | INTRAVENOUS | Status: AC
  Administered 2014-05-17: 23:00:00 via INTRAVENOUS

## 2014-05-17 NOTE — ED Notes (Signed)
Bed: [id number] Expected date:  Expected time:  Means of arrival:  Comments: Hold for triage 7

## 2014-05-17 NOTE — ED Provider Notes (Signed)
CSN: [id number]     Arrival date & time 05/17/14  2117 History  This chart was scribed for Earley Favor, NP, working with Audree Camel, MD by Chestine Spore, ED Scribe. The patient was seen in room WTR7/WTR7 at 10:32 PM.     Chief Complaint  Patient presents with  . Headache    The history is provided by the patient.   HPI Comments: Michael DISSINGER is a 23 y.o. male who presents to the Emergency Department complaining of HA onset 3 days ago. He states that the pain is behind his right eye and causing him pain in his teeth. He states that he has been taking Motrin and Benadryl which helped to alleviate his symptoms a little. He states that when he woke up this morning his right eye was swollen. He states that he does not remember getting bit by anything. He states that his eyes are always red. He denies any other associated symptoms. He denies any injury to the right eye. He states that he had laser eye surgery for his right eye this past august for vision corrections. He states that he is not allergic to any medications    He denies nausea, vomiting,   Past Medical History  Diagnosis Date  . Asthma    Past Surgical History  Procedure Laterality Date  . Eye surgery Left    History reviewed. No pertinent family history. History  Substance Use Topics  . Smoking status: Never Smoker   . Smokeless tobacco: Not on file  . Alcohol Use: Yes     Comment: social    Review of Systems  Constitutional: Negative for fever.  HENT: Negative for rhinorrhea.   Eyes: Positive for pain and redness. Negative for photophobia and visual disturbance.  Skin: Negative for wound.  Neurological: Positive for headaches. Negative for dizziness.  All other systems reviewed and are negative.     Allergies  Review of patient's allergies indicates no known allergies.  Home Medications   Prior to Admission medications   Medication Sig Start Date End Date Taking? Authorizing Provider  albuterol  (PROVENTIL HFA;VENTOLIN HFA) 108 (90 BASE) MCG/ACT inhaler Inhale 2 puffs into the lungs daily as needed for wheezing or shortness of breath. For shortness of breath   Yes Historical Provider, MD  diphenhydrAMINE (BENADRYL) 25 MG tablet Take 50 mg by mouth every 6 (six) hours as needed (for symptoms).   Yes Historical Provider, MD  ibuprofen (ADVIL) 200 MG tablet Take 800 mg by mouth every 6 (six) hours as needed (for pain).   Yes Historical Provider, MD  Phenyleph-CPM-DM-APAP (TYLENOL COLD HEAD CONGESTION) 03-04-09-325 MG TABS Take 2 tablets by mouth as needed (for sinus pain.).   Yes Historical Provider, MD  pseudoephedrine (SUDAFED) 60 MG tablet Take 120 mg by mouth every 4 (four) hours as needed for congestion.   Yes Historical Provider, MD   BP 132/72  Pulse 85  Temp(Src) 98.1 F (36.7 C) (Oral)  Resp 18  SpO2 100%  Physical Exam  Nursing note and vitals reviewed. Constitutional: He is oriented to person, place, and time. He appears well-developed and well-nourished.  HENT:  Head: Normocephalic.  Right Ear: External ear normal.  Left Ear: External ear normal.  Nose: Rhinorrhea present. No mucosal edema. Right sinus exhibits maxillary sinus tenderness and frontal sinus tenderness. Left sinus exhibits no maxillary sinus tenderness and no frontal sinus tenderness.  Mouth/Throat: Oropharynx is clear and moist.  Eyes: EOM are normal. Pupils are equal, round,  and reactive to light. Right conjunctiva is injected. Right conjunctiva has no hemorrhage. Left conjunctiva is injected. Right eye exhibits no nystagmus. Left eye exhibits no nystagmus.    Erythema and swelling, surrounding the right eye, including the upper and lower lid No nystagmus.  Pain with movement of the eye, particularly upward  Neck: Normal range of motion.  Cardiovascular: Normal rate.   Pulmonary/Chest: Effort normal.  Musculoskeletal: Normal range of motion.  Lymphadenopathy:    He has no cervical adenopathy.   Neurological: He is alert and oriented to person, place, and time.  Skin: Skin is warm.    ED Course  Procedures (including critical care time) DIAGNOSTIC STUDIES: Oxygen Saturation is 100% on room air, normal by my interpretation.    COORDINATION OF CARE: 10:34 PM-Discussed treatment plan which includes antibiotic and pain control with pt at bedside and pt agreed to plan.   Labs Review Labs Reviewed - No data to display  Imaging Review No results found.   EKG Interpretation None      MDM  Patient's physical exam concern for periorbital cellulitis.  Patient will be scheduled for CT scan of the orbits, CBC i-STAT, pain control patient was given IV antibiotics as well as IV pain control and Toradol. The patient reports significant relief of his discomfort I have contacted Dr. Gwen PoundsKowalski who would like to see the patient in the office today to monitor his progress this has been conveyed to the patient who agrees he also has been given parameters for return Final diagnoses:  None       I personally performed the services described in this documentation, which was scribed in my presence. The recorded information has been reviewed and is accurate.    Arman FilterGail K Ozetta Flatley, NP 05/18/14 [id number]  Arman FilterGail K Khoen Genet, NP 05/18/14 [phone]

## 2014-05-17 NOTE — ED Notes (Signed)
Pt states he has a headache that he has had for the past 2 to 3 days  Pt is c/o pain behind his right eye and causing him pain in his teeth  Pt states this is the worse headache he has ever had  Denies nausea or vomiting

## 2014-05-18 ENCOUNTER — Inpatient Hospital Stay (HOSPITAL_COMMUNITY)
Admission: EM | Admit: 2014-05-18 | Discharge: 2014-05-20 | DRG: 153 | Disposition: A | Discharge status: Home / Self Care | Payer: Self-pay | Attending: Internal Medicine | Admitting: Internal Medicine

## 2014-05-18 ENCOUNTER — Encounter (HOSPITAL_COMMUNITY): Payer: Self-pay | Admitting: Emergency Medicine

## 2014-05-18 DIAGNOSIS — L0201 Cutaneous abscess of face: Secondary | ICD-10-CM | POA: Diagnosis present

## 2014-05-18 DIAGNOSIS — L03211 Cellulitis of face: Secondary | ICD-10-CM

## 2014-05-18 DIAGNOSIS — L03213 Periorbital cellulitis: Secondary | ICD-10-CM

## 2014-05-18 DIAGNOSIS — J019 Acute sinusitis, unspecified: Principal | ICD-10-CM | POA: Diagnosis present

## 2014-05-18 DIAGNOSIS — H0011 Chalazion right upper eyelid: Secondary | ICD-10-CM

## 2014-05-18 DIAGNOSIS — J018 Other acute sinusitis: Secondary | ICD-10-CM

## 2014-05-18 DIAGNOSIS — J45909 Unspecified asthma, uncomplicated: Secondary | ICD-10-CM

## 2014-05-18 DIAGNOSIS — H0019 Chalazion unspecified eye, unspecified eyelid: Secondary | ICD-10-CM | POA: Diagnosis present

## 2014-05-18 LAB — CBC WITH DIFFERENTIAL/PLATELET
BASOS ABS: 0 10*3/uL (ref 0.0–0.1)
Basophils Relative: 0 % (ref 0–1)
Eosinophils Absolute: 0 10*3/uL (ref 0.0–0.7)
Eosinophils Relative: 0 % (ref 0–5)
HCT: 41.9 % (ref 39.0–52.0)
Hemoglobin: 14.9 g/dL (ref 13.0–17.0)
Lymphocytes Relative: 9 % — ABNORMAL LOW (ref 12–46)
Lymphs Abs: 0.9 10*3/uL (ref 0.7–4.0)
MCH: 30 pg (ref 26.0–34.0)
MCHC: 35.6 g/dL (ref 30.0–36.0)
MCV: 84.3 fL (ref 78.0–100.0)
Monocytes Absolute: 1.4 10*3/uL — ABNORMAL HIGH (ref 0.1–1.0)
Monocytes Relative: 14 % — ABNORMAL HIGH (ref 3–12)
Neutro Abs: 7.4 10*3/uL (ref 1.7–7.7)
Neutrophils Relative %: 77 % (ref 43–77)
Platelets: 186 10*3/uL (ref 150–400)
RBC: 4.97 MIL/uL (ref 4.22–5.81)
RDW: 12.2 % (ref 11.5–15.5)
WBC: 9.7 10*3/uL (ref 4.0–10.5)

## 2014-05-18 LAB — BASIC METABOLIC PANEL
ANION GAP: 14 (ref 5–15)
BUN: 9 mg/dL (ref 6–23)
CALCIUM: 9.6 mg/dL (ref 8.4–10.5)
CO2: 23 mEq/L (ref 19–32)
Chloride: 97 mEq/L (ref 96–112)
Creatinine, Ser: 1.08 mg/dL (ref 0.50–1.35)
GFR calc Af Amer: >90 mL/min (ref >90)
Glucose, Bld: 90 mg/dL (ref 70–99)
Potassium: 3.5 mEq/L — ABNORMAL LOW (ref 3.7–5.3)
SODIUM: 134 meq/L — AB (ref 137–147)

## 2014-05-18 MED ORDER — OXYCODONE-ACETAMINOPHEN 5-325 MG PO TABS
2.0000 | ORAL_TABLET | Freq: Once | ORAL | Status: AC
  Administered 2014-05-18: 2 via ORAL
  Filled 2014-05-18: qty 2

## 2014-05-18 MED ORDER — ACETAMINOPHEN 325 MG PO TABS
650.0000 mg | ORAL_TABLET | Freq: Four times a day (QID) | ORAL | Status: DC | PRN
  Administered 2014-05-19 – 2014-05-20 (×2): 650 mg via ORAL
  Filled 2014-05-18 (×2): qty 2

## 2014-05-18 MED ORDER — HYDROMORPHONE HCL PF 1 MG/ML IJ SOLN
1.0000 mg | INTRAMUSCULAR | Status: DC | PRN
Start: 2014-05-18 — End: 2014-05-19
  Administered 2014-05-18 – 2014-05-19 (×2): 1 mg via INTRAVENOUS
  Filled 2014-05-18 (×2): qty 1

## 2014-05-18 MED ORDER — VANCOMYCIN HCL IN DEXTROSE 1-5 GM/200ML-% IV SOLN
1000.0000 mg | Freq: Three times a day (TID) | INTRAVENOUS | Status: DC
  Administered 2014-05-18 – 2014-05-20 (×5): 1000 mg via INTRAVENOUS
  Filled 2014-05-18 (×5): qty 200

## 2014-05-18 MED ORDER — PIPERACILLIN-TAZOBACTAM 3.375 G IVPB
3.3750 g | Freq: Three times a day (TID) | INTRAVENOUS | Status: DC
  Administered 2014-05-18: 3.375 g via INTRAVENOUS
  Filled 2014-05-18 (×2): qty 50

## 2014-05-18 MED ORDER — IOHEXOL 300 MG/ML  SOLN
100.0000 mL | Freq: Once | INTRAMUSCULAR | Status: AC | PRN
  Administered 2014-05-18: 100 mL via INTRAVENOUS

## 2014-05-18 MED ORDER — ONDANSETRON HCL 4 MG/2ML IJ SOLN: 4.0000 mg | Freq: Four times a day (QID) | INTRAMUSCULAR | Status: DC | PRN

## 2014-05-18 MED ORDER — ALBUTEROL SULFATE (2.5 MG/3ML) 0.083% IN NEBU: 3.0000 mL | INHALATION_SOLUTION | Freq: Every day | RESPIRATORY_TRACT | Status: DC | PRN

## 2014-05-18 MED ORDER — SODIUM CHLORIDE 0.9 % IV SOLN
INTRAVENOUS | Status: DC
  Administered 2014-05-18: 23:00:00 via INTRAVENOUS
  Administered 2014-05-19: 100 mL via INTRAVENOUS

## 2014-05-18 MED ORDER — ONDANSETRON HCL 4 MG PO TABS: 4.0000 mg | ORAL_TABLET | Freq: Four times a day (QID) | ORAL | Status: DC | PRN

## 2014-05-18 MED ORDER — HYDROMORPHONE HCL PF 1 MG/ML IJ SOLN
1.0000 mg | Freq: Once | INTRAMUSCULAR | Status: AC
  Administered 2014-05-18: 1 mg via INTRAVENOUS
  Filled 2014-05-18: qty 1

## 2014-05-18 MED ORDER — ACETAMINOPHEN 650 MG RE SUPP: 650.0000 mg | Freq: Four times a day (QID) | RECTAL | Status: DC | PRN

## 2014-05-18 MED ORDER — LEVOFLOXACIN 500 MG PO TABS: 500.0000 mg | ORAL_TABLET | Freq: Every day | ORAL | Status: DC

## 2014-05-18 MED ORDER — ACETAMINOPHEN 325 MG PO TABS
650.0000 mg | ORAL_TABLET | Freq: Four times a day (QID) | ORAL | Status: DC | PRN
  Administered 2014-05-18: 650 mg via ORAL
  Filled 2014-05-18: qty 2

## 2014-05-18 MED ORDER — CLINDAMYCIN PHOSPHATE 600 MG/50ML IV SOLN
600.0000 mg | Freq: Once | INTRAVENOUS | Status: AC
  Administered 2014-05-18: 600 mg via INTRAVENOUS
  Filled 2014-05-18: qty 50

## 2014-05-18 MED ORDER — OXYCODONE-ACETAMINOPHEN 5-325 MG PO TABS: 1.0000 | ORAL_TABLET | Freq: Four times a day (QID) | ORAL | Status: DC | PRN

## 2014-05-18 MED ORDER — MORPHINE SULFATE 4 MG/ML IJ SOLN
4.0000 mg | Freq: Once | INTRAMUSCULAR | Status: AC
  Administered 2014-05-18: 4 mg via INTRAVENOUS
  Filled 2014-05-18: qty 1

## 2014-05-18 MED ORDER — SODIUM CHLORIDE 0.9 % IV SOLN
INTRAVENOUS | Status: DC
  Administered 2014-05-18: 19:00:00 via INTRAVENOUS

## 2014-05-18 MED ORDER — ENOXAPARIN SODIUM 40 MG/0.4ML ~~LOC~~ SOLN
40.0000 mg | Freq: Every day | SUBCUTANEOUS | Status: DC
  Administered 2014-05-18 – 2014-05-19 (×2): 40 mg via SUBCUTANEOUS
  Filled 2014-05-18 (×3): qty 0.4

## 2014-05-18 MED ORDER — KETOROLAC TROMETHAMINE 30 MG/ML IJ SOLN
30.0000 mg | Freq: Once | INTRAMUSCULAR | Status: AC
  Administered 2014-05-18: 30 mg via INTRAVENOUS
  Filled 2014-05-18: qty 1

## 2014-05-18 MED ORDER — LEVOFLOXACIN IN D5W 750 MG/150ML IV SOLN
750.0000 mg | Freq: Once | INTRAVENOUS | Status: AC
  Administered 2014-05-18: 750 mg via INTRAVENOUS
  Filled 2014-05-18: qty 150

## 2014-05-18 MED ORDER — OXYCODONE HCL 5 MG PO TABS
5.0000 mg | ORAL_TABLET | ORAL | Status: DC | PRN
  Administered 2014-05-19: 5 mg via ORAL
  Filled 2014-05-18: qty 1

## 2014-05-18 NOTE — ED Provider Notes (Signed)
23 year old male presents with a complaint of right eye pain. Has been ongoing for between 2 and 3 days, gradually worsening and is now severe. On exam the patient has a swollen right upper lid, purulent nasal discharge and tenderness over the right maxillary sinus as well as complaining of dental pain in the upper teeth. There is no fever but the patient does appear very uncomfortable. CT scan of the maxillofacial structures in the orbit shows that he has a sinusitis as well as a pre-and post septal cellulitis. IV antibiotic started, discussed with hospitalist, we'll also discuss with ophthalmology at their request. The patient does not have a leukocytosis or fever but appears very uncomfortable.  Medical screening examination/treatment/procedure(s) were conducted as a shared visit with non-physician practitioner(s) and myself.  I personally evaluated the patient during the encounter.  Clinical Impression:  Post septal cellulitis  Vida RollerBrian D Marshal Eskew, MD 05/19/14 [phone]

## 2014-05-18 NOTE — ED Provider Notes (Signed)
CSN: [id number]     Arrival date & time 05/18/14  1742 History   First MD Initiated Contact with Patient 05/18/14 1827     Chief Complaint  Patient presents with  . Recurrent Sinusitis     (Consider location/radiation/quality/duration/timing/severity/associated sxs/prior Treatment) The history is provided by the patient.   patient complaining of persistent right orbital swelling. Seen here earlier this morning for similar symptoms and diagnosed with pre-septal and septal orbital cellulitis. He was given antibiotics and referred to ophthalmology. Seen by the ophthalmologist today and referred back here because of concern that he might need for surgical intervention of his maxillary and ethmoid sinusitis. He denies any visual changes. No nausea vomiting. Symptoms persisted nothing makes them worse.  Past Medical History  Diagnosis Date  . Asthma    Past Surgical History  Procedure Laterality Date  . Eye surgery Left    No family history on file. History  Substance Use Topics  . Smoking status: Never Smoker   . Smokeless tobacco: Not on file  . Alcohol Use: Yes     Comment: social    Review of Systems  All other systems reviewed and are negative.     Allergies  Review of patient's allergies indicates no known allergies.  Home Medications   Prior to Admission medications   Medication Sig Start Date End Date Taking? Authorizing Provider  albuterol (PROVENTIL HFA;VENTOLIN HFA) 108 (90 BASE) MCG/ACT inhaler Inhale 2 puffs into the lungs daily as needed for wheezing or shortness of breath. For shortness of breath   Yes Historical Provider, MD  diphenhydrAMINE (BENADRYL) 25 MG tablet Take 50 mg by mouth every 6 (six) hours as needed (for symptoms).   Yes Historical Provider, MD  ibuprofen (ADVIL) 200 MG tablet Take 800 mg by mouth every 6 (six) hours as needed (for pain).   Yes Historical Provider, MD  levofloxacin (LEVAQUIN) 500 MG tablet Take 1 tablet (500 mg total) by mouth  daily. 05/18/14  Yes Arman FilterGail K Schulz, NP  oxyCODONE-acetaminophen (PERCOCET/ROXICET) 5-325 MG per tablet Take 1-2 tablets by mouth every 6 (six) hours as needed for severe pain. 05/18/14  Yes Arman FilterGail K Schulz, NP  Phenyleph-CPM-DM-APAP (TYLENOL COLD HEAD CONGESTION) 03-04-09-325 MG TABS Take 2 tablets by mouth as needed (for sinus pain.).   Yes Historical Provider, MD  pseudoephedrine (SUDAFED) 60 MG tablet Take 120 mg by mouth every 4 (four) hours as needed for congestion.   Yes Historical Provider, MD   BP 154/88  Pulse 99  Temp(Src) 99.2 F (37.3 C) (Oral)  Resp 22  SpO2 100% Physical Exam  Nursing note and vitals reviewed. Constitutional: He is oriented to person, place, and time. He appears well-developed and well-nourished.  Non-toxic appearance. No distress.  HENT:  Head: Normocephalic and atraumatic.    Eyes: Conjunctivae, EOM and lids are normal. Pupils are equal, round, and reactive to light.    Neck: Normal range of motion. Neck supple. No tracheal deviation present. No mass present.  Cardiovascular: Normal rate, regular rhythm and normal heart sounds.  Exam reveals no gallop.   No murmur heard. Pulmonary/Chest: Effort normal and breath sounds normal. No stridor. No respiratory distress. He has no decreased breath sounds. He has no wheezes. He has no rhonchi. He has no rales.  Abdominal: Soft. Normal appearance and bowel sounds are normal. He exhibits no distension. There is no tenderness. There is no rebound and no CVA tenderness.  Musculoskeletal: Normal range of motion. He exhibits no edema and no tenderness.  Neurological: He is alert and oriented to person, place, and time. He has normal strength. No cranial nerve deficit or sensory deficit. GCS eye subscore is 4. GCS verbal subscore is 5. GCS motor subscore is 6.  Skin: Skin is warm and dry. No abrasion and no rash noted.  Psychiatric: He has a normal mood and affect. His speech is normal and behavior is normal.    ED Course    Procedures (including critical care time) Labs Review Labs Reviewed  CBC WITH DIFFERENTIAL  BASIC METABOLIC PANEL    Imaging Review Ct Orbits W/cm  05/18/2014   CLINICAL DATA:  Pain and swelling around the right eye for 1 day. No known injury.  EXAM: CT ORBITS WITH CONTRAST  TECHNIQUE: Multidetector CT imaging of the orbits was performed following the bolus administration of intravenous contrast.  CONTRAST:  OMNIPAQUE IOHEXOL 300 MG/ML  SOLN  COMPARISON:  None.  FINDINGS: There is soft tissue swelling around the right orbit, which is predominately preseptal. There is subtle fat infiltration in the postseptal compartment, mainly extraconal. The right superior oblique muscle belly is asymmetrically thickened. No subperiosteal abscess. Patent superior ophthalmic vein and imaged angular facial vein. There is sinusitis with fluid levels on the right more than left. No visible bony breakdown. No evidence of acute globe abnormality. The patient is status post scleral banding on the left. The image intracranial compartment is unremarkable.  IMPRESSION: 1. Pre and postseptal right orbital cellulitis. Postseptal inflammation is centered around the superior oblique. No abscess or venous occlusion. 2. Multi focal sinusitis, including the right maxillary and ethmoid sinuses.   Electronically Signed   By: Tiburcio Pea M.D.   On: 05/18/2014 01:05     EKG Interpretation None      MDM   Final diagnoses:  None    Spoke with Dr. Pollyann Kennedy, ENT on call, he recommended admission to hospital service for IV antibiotics. He will see the patient in consultation either this evening or tomorrow. No acute surgical emergency noted    Toy Baker, MD 05/18/14 1921

## 2014-05-18 NOTE — ED Notes (Signed)
Per pt, was here last night for the same symptoms-referred to Opthalmology

## 2014-05-18 NOTE — Discharge Instructions (Signed)
He was started on antibiotics in the emergency Department IV Levaquin to be given a prescription for the same antibiotic to take by mouth please take all the medication as directed until all tablets have been completed we've also been given a prescription for Percocet for pain control please use this as needed he can also use ibuprofen or Advil. [facility] would like to see you in the office today please call for an ointment time, he will then assess you and monitor your healing.

## 2014-05-18 NOTE — Consult Note (Signed)
Reason for Consult: Periorbital cellulitis Referring Physician: Toy Baker, MD  Michael Clayton is an 23 y.o. male.  HPI: 4 day history of nasal congestion, discharge and right eye swelling and discomfort. Soft mildly earlier today. CT reveals a cellulitis, preseptal and sinus disease but no abscess. No prior history of nasal or sinus problems.  Past Medical History  Diagnosis Date  . Asthma     Past Surgical History  Procedure Laterality Date  . Eye surgery Left     No family history on file.  Social History:  reports that he has never smoked. He does not have any smokeless tobacco history on file. He reports that he drinks alcohol. He reports that he does not use illicit drugs.  Allergies: No Known Allergies  Medications: Reviewed  Results for orders placed during the hospital encounter of 05/18/14 (from the past 48 hour(s))  CBC WITH DIFFERENTIAL     Status: Abnormal   Collection Time    05/18/14  7:05 PM      Result Value Ref Range   WBC 9.7  4.0 - 10.5 K/uL   RBC 4.97  4.22 - 5.81 MIL/uL   Hemoglobin 14.9  13.0 - 17.0 g/dL   HCT 16.1  09.6 - 04.5 %   MCV 84.3  78.0 - 100.0 fL   MCH 30.0  26.0 - 34.0 pg   MCHC 35.6  30.0 - 36.0 g/dL   RDW 40.9  81.1 - 91.4 %   Platelets 186  150 - 400 K/uL   Neutrophils Relative % 77  43 - 77 %   Neutro Abs 7.4  1.7 - 7.7 K/uL   Lymphocytes Relative 9 (*) 12 - 46 %   Lymphs Abs 0.9  0.7 - 4.0 K/uL   Monocytes Relative 14 (*) 3 - 12 %   Monocytes Absolute 1.4 (*) 0.1 - 1.0 K/uL   Eosinophils Relative 0  0 - 5 %   Eosinophils Absolute 0.0  0.0 - 0.7 K/uL   Basophils Relative 0  0 - 1 %   Basophils Absolute 0.0  0.0 - 0.1 K/uL    Ct Orbits W/cm  05/18/2014   CLINICAL DATA:  Pain and swelling around the right eye for 1 day. No known injury.  EXAM: CT ORBITS WITH CONTRAST  TECHNIQUE: Multidetector CT imaging of the orbits was performed following the bolus administration of intravenous contrast.  CONTRAST:  OMNIPAQUE IOHEXOL  300 MG/ML  SOLN  COMPARISON:  None.  FINDINGS: There is soft tissue swelling around the right orbit, which is predominately preseptal. There is subtle fat infiltration in the postseptal compartment, mainly extraconal. The right superior oblique muscle belly is asymmetrically thickened. No subperiosteal abscess. Patent superior ophthalmic vein and imaged angular facial vein. There is sinusitis with fluid levels on the right more than left. No visible bony breakdown. No evidence of acute globe abnormality. The patient is status post scleral banding on the left. The image intracranial compartment is unremarkable.  IMPRESSION: 1. Pre and postseptal right orbital cellulitis. Postseptal inflammation is centered around the superior oblique. No abscess or venous occlusion. 2. Multi focal sinusitis, including the right maxillary and ethmoid sinuses.   Electronically Signed   By: Tiburcio Pea M.D.   On: 05/18/2014 01:05    NWG:[id number] except as listed in admit H&P  Blood pressure 117/54, pulse 86, temperature 100.5 F (38.1 C), temperature source Oral, resp. rate 20, SpO2 98.00%.  PHYSICAL EXAM: Overall appearance:  Healthy appearing, in  no distress Head:  Normocephalic, atraumatic. Ears: External auditory canals are clear; tympanic membranes are intact in the middle ears are free of any effusion. Nose: External nose is healthy in appearance. Internal nasal exam reveals moderate mucosal edema on the left, severe mucosal edema on the right with complete instruction and purulent secretions. Oral Cavity:  There are no mucosal lesions or masses identified. Oral Pharynx/Hypopharynx/Larynx: no signs of any mucosal lesions or masses identified.    Neuro:  No identifiable neurologic deficits. Extraocular muscle activity intact. No chemosis present. Neck: No palpable neck masses.  Studies Reviewed: Orbital CT reviewed.  Procedures: none   Assessment/Plan: Acute sinusitis with periorbital cellulitis. No  evidence of abscess. No need for surgical intervention. Agree with plan to admit for intravenous antibiotics. If she does not improve in the next one or 2 days, we can reevaluate and repeat imaging.  Michael Clayton 05/18/2014, 8:40 PM

## 2014-05-18 NOTE — ED Provider Notes (Signed)
Medical screening examination/treatment/procedure(s) were performed by non-physician practitioner and as supervising physician I was immediately available for consultation/collaboration.   EKG Interpretation None        Nioma Mccubbins T Ziasia Lenoir, MD 05/18/14 1727 

## 2014-05-18 NOTE — H&P (Signed)
[facility] History and Physical  Michael Clayton ZOX:[id number] DOB: [birth year] DOA: 05/18/2014  Referring physician: ER physician. PCP: Default, Provider, MD   Chief Complaint: Pain around the right eye.  HPI: Michael Clayton is a 23 y.o. male with history of asthma started to experience headache the right frontal area 3 days ago with subjective feeling of fever and chills. Patient also had some sinus congestion. 2 days ago he noticed his right eye becoming swollen and had come to the ER last night and had CT orbit done which showed possible preseptal and post-septal orbital cellulitis. Patient was started on antibiotics and ophthalmology consult was obtained and patient had followed up with ophthalmologist as outpatient. Dr. Gwen PoundsKowalski saw the patient in his office and advised patient to come back to the ER because he needs IV antibiotics for his sinusitis and get ENT consult. As per the patient the ophthalmologist felt that his infection was more of sinusitis. Dr. Pollyann Kennedyosen ENT surgeon has already seen the patient and patient has been admitted for further management and IV antibiotics. Patient also has noticed increasing pain in his right upper teeth last 3 days and today he also noticed some bloody discharge from his nose. Denies any neck pain nor any pain in moving his eyes. No visual symptoms. Patient otherwise denies any similar episodes previously. Denies any chest pain or shortness of breath. Denies any trauma or insect bites. Denies using any IV drugs.  Review of Systems: As presented in the history of presenting illness, rest negative.  Past Medical History  Diagnosis Date  . Asthma    Past Surgical History  Procedure Laterality Date  . Eye surgery Left    Social History:  reports that he has never smoked. He does not have any smokeless tobacco history on file. He reports that he drinks alcohol. He reports that he does not use illicit drugs. Where does patient live home. Can patient  participate in ADLs? Yes.  No Known Allergies  Family History:  Family History  Problem Relation Age of Onset  . Diabetes Mellitus II Neg Hx       Prior to Admission medications   Medication Sig Start Date End Date Taking? Authorizing Provider  albuterol (PROVENTIL HFA;VENTOLIN HFA) 108 (90 BASE) MCG/ACT inhaler Inhale 2 puffs into the lungs daily as needed for wheezing or shortness of breath. For shortness of breath   Yes Historical Provider, MD  diphenhydrAMINE (BENADRYL) 25 MG tablet Take 50 mg by mouth every 6 (six) hours as needed (for symptoms).   Yes Historical Provider, MD  ibuprofen (ADVIL) 200 MG tablet Take 800 mg by mouth every 6 (six) hours as needed (for pain).   Yes Historical Provider, MD  levofloxacin (LEVAQUIN) 500 MG tablet Take 1 tablet (500 mg total) by mouth daily. 05/18/14  Yes Arman FilterGail K Schulz, NP  oxyCODONE-acetaminophen (PERCOCET/ROXICET) 5-325 MG per tablet Take 1-2 tablets by mouth every 6 (six) hours as needed for severe pain. 05/18/14  Yes Arman FilterGail K Schulz, NP  Phenyleph-CPM-DM-APAP (TYLENOL COLD HEAD CONGESTION) 03-04-09-325 MG TABS Take 2 tablets by mouth as needed (for sinus pain.).   Yes Historical Provider, MD  pseudoephedrine (SUDAFED) 60 MG tablet Take 120 mg by mouth every 4 (four) hours as needed for congestion.   Yes Historical Provider, MD    Physical Exam: Filed Vitals:   05/18/14 1958 05/18/14 2126 05/18/14 2142 05/18/14 2203  BP: 117/54  145/73 145/76  Pulse: 86 89  86  Temp: 100.5 F (38.1 C)  98.7 F (37.1 C) 100.4 F (38 C) 101.3 F (38.5 C)  TempSrc: Oral Oral Oral Oral  Resp: 20 20  20   Height:    6' (1.829 m)  Weight:    82.555 kg (182 lb)  SpO2: 98% 100%  97%     General:  Well-developed and nourished.  Eyes: Right periorbital swelling with swelling of the right upper eyelid. Patient's eye movement has no restriction. There is no tearing. PERRLA positive.  ENT: Mild puffiness of the maxillary area. I do not see any active discharge  from the nose at this time.  Neck: No neck rigidity no mass felt.  Cardiovascular: S1-S2 heard.  Respiratory: No rhonchi or crepitations.  Abdomen: Soft nontender bowel sounds present. No guarding rigidity.  Skin: No rash.  Musculoskeletal: No edema.  Psychiatric: Appears normal.  Neurologic: Alert awake oriented to time place and person. Moves all extremities.  Labs on Admission:  Basic Metabolic Panel:  Recent Labs Lab 05/17/14 2315 05/18/14 1905  NA 135* 134*  K 4.6 3.5*  CL 97 97  CO2  --  23  GLUCOSE 74 90  BUN 13 9  CREATININE 1.10 1.08  CALCIUM  --  9.6   Liver Function Tests: No results found for this basename: AST, ALT, ALKPHOS, BILITOT, PROT, ALBUMIN,  in the last 168 hours No results found for this basename: LIPASE, AMYLASE,  in the last 168 hours No results found for this basename: AMMONIA,  in the last 168 hours CBC:  Recent Labs Lab 05/17/14 2303 05/17/14 2315 05/18/14 1905  WBC 7.9  --  9.7  NEUTROABS  --   --  7.4  HGB 15.9 16.7 14.9  HCT 45.3 49.0 41.9  MCV 85.6  --  84.3  PLT 191  --  186   Cardiac Enzymes: No results found for this basename: CKTOTAL, CKMB, CKMBINDEX, TROPONINI,  in the last 168 hours  BNP (last 3 results) No results found for this basename: PROBNP,  in the last 8760 hours CBG: No results found for this basename: GLUCAP,  in the last 168 hours  Radiological Exams on Admission: Ct Orbits W/cm  05/18/2014   CLINICAL DATA:  Pain and swelling around the right eye for 1 day. No known injury.  EXAM: CT ORBITS WITH CONTRAST  TECHNIQUE: Multidetector CT imaging of the orbits was performed following the bolus administration of intravenous contrast.  CONTRAST:  OMNIPAQUE IOHEXOL 300 MG/ML  SOLN  COMPARISON:  None.  FINDINGS: There is soft tissue swelling around the right orbit, which is predominately preseptal. There is subtle fat infiltration in the postseptal compartment, mainly extraconal. The right superior oblique  muscle belly is asymmetrically thickened. No subperiosteal abscess. Patent superior ophthalmic vein and imaged angular facial vein. There is sinusitis with fluid levels on the right more than left. No visible bony breakdown. No evidence of acute globe abnormality. The patient is status post scleral banding on the left. The image intracranial compartment is unremarkable.  IMPRESSION: 1. Pre and postseptal right orbital cellulitis. Postseptal inflammation is centered around the superior oblique. No abscess or venous occlusion. 2. Multi focal sinusitis, including the right maxillary and ethmoid sinuses.   Electronically Signed   By: Tiburcio Pea M.D.   On: 05/18/2014 01:05     Assessment/Plan Principal Problem:   Acute sinusitis Active Problems:   Periorbital cellulitis of right eye   Asthma   1. Acute sinusitis with possible periorbital cellulitis - appreciate ENT consult. As this time I  have placed patient on vancomycin and Zosyn. Per ENT consult patient is to be observed for next 24-48 hours and if there is no improvement then we may have to rescan to check for any further worsening. Continue with pain relief medications. May discuss with ophthalmologist if eye pain worsens. Since patient is complaining of gum pain particularly around the right upper set of teeth we will try to get x-ray to check for any developing tooth abscess. 2. Bronchial asthma - presently not wheezing. Continue when necessary albuterol.  Patient is presently on Lovenox for DVT prophylaxis. Patient did state that he did have some bloody discharge today from his nose. Closely observe.  Code Status: Full code.  Family Communication: None.  Disposition Plan: Admit to inpatient.    Laquiesha Piacente N. [facility] Pager [phone].  If 7PM-7AM, please contact night-coverage [email or web address] Password [facility] 05/18/2014, 10:44 PM

## 2014-05-18 NOTE — Progress Notes (Signed)
ANTIBIOTIC CONSULT NOTE - INITIAL  Pharmacy Consult for Vancomycin and Zosyn Indication: Periorbital cellulitis  No Known Allergies  Patient Measurements: Height: 6' (182.9 cm) Weight: 182 lb (82.555 kg) IBW/kg (Calculated) : 77.6   Vital Signs: Temp: 100.1 F (37.8 C) (07/16 2300) Temp src: Oral (07/16 2300) BP: 145/76 mmHg (07/16 2203) Pulse Rate: 86 (07/16 2203) Intake/Output from previous day:   Intake/Output from this shift:    Labs:  Recent Labs  05/17/14 2303 05/17/14 2315 05/18/14 1905  WBC 7.9  --  9.7  HGB 15.9 16.7 14.9  PLT 191  --  186  CREATININE  --  1.10 1.08   Estimated Creatinine Clearance: 114.8 ml/min (by C-G formula based on Cr of 1.08). No results found for this basename: VANCOTROUGH, VANCOPEAK, VANCORANDOM, GENTTROUGH, GENTPEAK, GENTRANDOM, TOBRATROUGH, TOBRAPEAK, TOBRARND, AMIKACINPEAK, AMIKACINTROU, AMIKACIN,  in the last 72 hours   Microbiology: No results found for this or any previous visit (from the past 720 hour(s)).  Medical History: Past Medical History  Diagnosis Date  . Asthma     Medications:  Scheduled:  . enoxaparin (LOVENOX) injection  40 mg Subcutaneous QHS  . piperacillin-tazobactam (ZOSYN)  IV  3.375 g Intravenous Q8H  . vancomycin  1,000 mg Intravenous Q8H   Infusions:  . sodium chloride     Assessment: 23 yo with hx asthma c/o ha in the right frontal lobe x 3 days with fever and chills.  Pt with possible preseptal and post-septal orbital cellulitis.  Vancomycin and Zosyn per Rx for perorbital cellulitis.   Goal of Therapy:  Vancomycin trough level 15-20 mcg/ml  Plan:   Zosyn 3.375 Gm IV q8h EI infusion  Vancomycin 1Gm IV q8h  F/U SCr/levels/cultures as needed  Lorenza EvangelistGreen, Fawaz Borquez R 05/18/2014,11:34 PM

## 2014-05-18 NOTE — ED Notes (Signed)
Pt was seen/discharged this morning and directed to follow up w/ Gwen PoundsKowalski MD.  Pt diagnosed w/ periorbital cellulitis.  Gwen PoundsKowalski directed Pt to return to ED.  Gwen PoundsKowalski spoke w/ this Clinical research associatewriter and reports that the Pt needs a consult to ENT and possibly have his sinus drained.  Sts this is not an eye issue.  Gwen PoundsKowalski can be contacted at [phone].

## 2014-05-19 ENCOUNTER — Inpatient Hospital Stay (HOSPITAL_COMMUNITY): Payer: Self-pay

## 2014-05-19 DIAGNOSIS — H0011 Chalazion right upper eyelid: Secondary | ICD-10-CM | POA: Diagnosis present

## 2014-05-19 LAB — COMPREHENSIVE METABOLIC PANEL
ALBUMIN: 2.9 g/dL — AB (ref 3.5–5.2)
ALT: 8 U/L (ref 0–53)
ANION GAP: 13 (ref 5–15)
AST: 9 U/L (ref 0–37)
Alkaline Phosphatase: 57 U/L (ref 39–117)
BILIRUBIN TOTAL: 1.6 mg/dL — AB (ref 0.3–1.2)
BUN: 7 mg/dL (ref 6–23)
CHLORIDE: 100 meq/L (ref 96–112)
CO2: 24 meq/L (ref 19–32)
Calcium: 9.1 mg/dL (ref 8.4–10.5)
Creatinine, Ser: 1.19 mg/dL (ref 0.50–1.35)
GFR calc Af Amer: >90 mL/min (ref >90)
GFR, EST NON AFRICAN AMERICAN: 84 mL/min — AB (ref >90)
Glucose, Bld: 100 mg/dL — ABNORMAL HIGH (ref 70–99)
Potassium: 3.7 mEq/L (ref 3.7–5.3)
Sodium: 137 mEq/L (ref 137–147)
Total Protein: 6.8 g/dL (ref 6.0–8.3)

## 2014-05-19 LAB — CBC WITH DIFFERENTIAL/PLATELET
BASOS ABS: 0 10*3/uL (ref 0.0–0.1)
BASOS PCT: 0 % (ref 0–1)
EOS PCT: 0 % (ref 0–5)
Eosinophils Absolute: 0 10*3/uL (ref 0.0–0.7)
HCT: 41.5 % (ref 39.0–52.0)
Hemoglobin: 14.2 g/dL (ref 13.0–17.0)
Lymphocytes Relative: 10 % — ABNORMAL LOW (ref 12–46)
Lymphs Abs: 0.9 10*3/uL (ref 0.7–4.0)
MCH: 29.2 pg (ref 26.0–34.0)
MCHC: 34.2 g/dL (ref 30.0–36.0)
MCV: 85.4 fL (ref 78.0–100.0)
Monocytes Absolute: 1.6 10*3/uL — ABNORMAL HIGH (ref 0.1–1.0)
Monocytes Relative: 18 % — ABNORMAL HIGH (ref 3–12)
NEUTROS ABS: 6.5 10*3/uL (ref 1.7–7.7)
Neutrophils Relative %: 72 % (ref 43–77)
Platelets: 196 10*3/uL (ref 150–400)
RBC: 4.86 MIL/uL (ref 4.22–5.81)
RDW: 12.3 % (ref 11.5–15.5)
WBC: 9 10*3/uL (ref 4.0–10.5)

## 2014-05-19 MED ORDER — HYDROCODONE-ACETAMINOPHEN 5-325 MG PO TABS
1.0000 | ORAL_TABLET | ORAL | Status: DC | PRN
  Administered 2014-05-19 – 2014-05-20 (×3): 2 via ORAL
  Filled 2014-05-19 (×3): qty 2

## 2014-05-19 MED ORDER — HYDROMORPHONE HCL PF 1 MG/ML IJ SOLN
1.0000 mg | INTRAMUSCULAR | Status: DC | PRN
  Administered 2014-05-19 (×4): 2 mg via INTRAVENOUS
  Administered 2014-05-20: 1 mg via INTRAVENOUS
  Filled 2014-05-19 (×5): qty 2

## 2014-05-19 MED ORDER — PIPERACILLIN-TAZOBACTAM 3.375 G IVPB
3.3750 g | Freq: Three times a day (TID) | INTRAVENOUS | Status: DC
  Administered 2014-05-19 – 2014-05-20 (×4): 3.375 g via INTRAVENOUS
  Filled 2014-05-19 (×4): qty 50

## 2014-05-19 MED ORDER — IBUPROFEN 600 MG PO TABS
600.0000 mg | ORAL_TABLET | Freq: Three times a day (TID) | ORAL | Status: AC
  Administered 2014-05-19 (×3): 600 mg via ORAL
  Filled 2014-05-19 (×3): qty 1

## 2014-05-19 NOTE — Progress Notes (Signed)
CARE MANAGEMENT NOTE 05/19/2014  Patient:  Michael Clayton,Michael Clayton   Account Number:  [id number]  Date Initiated:  05/19/2014  Documentation initiated by:  [facility],Arlena Marsan  Subjective/Objective Assessment:   23 year old male admitted with acute sinusitis.     Action/Plan:   From home.   Anticipated DC Date:  05/22/2014   Anticipated DC Plan:  HOME/SELF CARE      DC Planning Services  CM consult      Choice offered to / List presented to:             Status of service:  In process, will continue to follow Medicare Important Message given?   (If response is "NO", the following Medicare IM given date fields will be blank) Date Medicare IM given:   Medicare IM given by:   Date Additional Medicare IM given:   Additional Medicare IM given by:    Discharge Disposition:    Per UR Regulation:  Reviewed for med. necessity/level of care/duration of stay  If discussed at Long Length of Stay Meetings, dates discussed:    Comments:

## 2014-05-19 NOTE — Consult Note (Signed)
  PATIENT SEEN BY DR. Cosima Prentiss AS AN OUTPATIENT ON 05/18/2014.   VA OD: 20/20 VA OS: 20/100  PUPILS: PERRL WITH NO APD  EOM: NORMAL OU  IOPS: 16 OU  SLIT LAMP EXAM:  LIDS, LASHES,LACRIMAL: RUL EDEMA AND CHALAZION CONJUNCTIVA: CLEAR OU CORNEA: CLEAR OU A/C: DEEP AND QUIET IRIS: NORMAL LENS: CLEAR  DILATED FUNDUS EXAM: VITREOUS: NORMAL C/D: 0.3 MACULA: OD: NORMAL   OS: DERMARCATION LINES, RPE CHANGES VESSELS: NORMAL PERIPHERY:  OD- LASER SCARS INFERIOR AND INFEROTEMPORAL/ MULTIPLE RETINAL HOLES OS-SCLERAL BUCKLE, MULTIPLE RETINAL HOLES  ASSESSMENT:  1) RUL EDEMA SECONDARY TO CHALAZION   ORBITAL CELLULITIS HALLMARKS INCLUDE: PROPTOSIS, CONJUNCTIVAL INJECTION, EOM RESTRICTION, AND PUPILLARY ABNORMALITY-PATIENT HAS NONE OF THESE AND HIS APPEARS TO ONLY HAVE PRESEPTAL EDEMA SECONDARY TO CHALAZION RATHER THAN CELLULITIS  2) RIGHT SINUSITIS   PLAN: IV ANTIBIOTICS SINCE THERE IS NO ABCESS THAT EXISTS IN THE SINUSES      DR. Trish FountainPAUL V Andrina Locken, MD

## 2014-05-19 NOTE — Progress Notes (Signed)
Patient alert, talking on phone, right orbital swelling noted, ice pack applied to swelling.  Tissues have minimal blood tinged mucous when blows nose

## 2014-05-19 NOTE — Progress Notes (Signed)
PROGRESS NOTE    Michael Clayton ZOX:[id number] DOB: [birth year] DOA: 05/18/2014 PCP: Default, Provider, MD  HPI/Brief narrative 23 year old male with history of asthma, had headache in right frontal area of 3 days duration with subjective fever, chills and sinus congestion. 2 days ago he noted swelling of right eye and presented to the ED where CT orbit showed possible preseptal and post-septal orbital cellulitis. She was started on antibiotics and seen by outpatient ophthalmologist. Ophthalmologist advised him to come back to the ER for ENT consult and IV antibiotics for sinusitis. ENT consulted. Patient improving.   Assessment/Plan:  1. Acute sinusitis, right >left and? Facial cellulitis: Continue empiric IV vancomycin and Zosyn and supportive therapy with anti-inflammatories, pain management. Improving. Could transition to by mouth antibiotics and discharge in a.m.ENT input appreciated. 2. Right upper eyelid edema secondary to chalazion: Ophthalmology input from today appreciated-indicate no orbital cellulitis and patient's preseptal edema is secondary to chalazion rather than cellulitis. Improving. 3. History of asthma: Stable  Code Status: Full Family Communication: None at bedside Disposition Plan: Home possibly 7/18   Consultants:  ENT  Ophthalmology  Procedures:  None  Antibiotics:  IV vancomycin and Zosyn   Subjective: Feels much better. Improved swelling and pain around and behind right eye but still has throbbing 6/10 pain. No nausea or vomiting. No teeth pain.  Objective: Filed Vitals:   05/18/14 2300 05/19/14 0211 05/19/14 0533 05/19/14 1134  BP:   127/71   Pulse:   99   Temp: 100.1 F (37.8 C) 99.3 F (37.4 C) 100.5 F (38.1 C) 98.1 F (36.7 C)  TempSrc: Oral Oral Oral Oral  Resp:   20   Height:      Weight:      SpO2:   98%     Intake/Output Summary (Last 24 hours) at 05/19/14 1553 Last data filed at 05/19/14 0947  Gross per 24 hour  Intake     875 ml  Output    900 ml  Net    -25 ml   Filed Weights   05/18/14 2203  Weight: 82.555 kg (182 lb)     Exam:  General exam: Pleasant young male lying comfortably in bed. Respiratory system: Clear. No increased work of breathing. Cardiovascular system: S1 & S2 heard, RRR. No JVD, murmurs, gallops, clicks or pedal edema. Gastrointestinal system: Abdomen is nondistended, soft and nontender. Normal bowel sounds heard. Central nervous system: Alert and oriented. No focal neurological deficits. Extremities: Symmetric 5 x 5 power. HEENT: Right upper eyelid edema and chalazion, pupils equally reacting to light and accommodation, extraocular muscle movements intact, vision normal. Mild periorbital edema and able to open right eye with some restriction. Mild tenderness around right eye.   Data Reviewed: Basic Metabolic Panel:  Recent Labs Lab 05/17/14 2315 05/18/14 1905 05/19/14 0545  NA 135* 134* 137  K 4.6 3.5* 3.7  CL 97 97 100  CO2  --  23 24  GLUCOSE 74 90 100*  BUN 13 9 7   CREATININE 1.10 1.08 1.19  CALCIUM  --  9.6 9.1   Liver Function Tests:  Recent Labs Lab 05/19/14 0545  AST 9  ALT 8  ALKPHOS 57  BILITOT 1.6*  PROT 6.8  ALBUMIN 2.9*   No results found for this basename: LIPASE, AMYLASE,  in the last 168 hours No results found for this basename: AMMONIA,  in the last 168 hours CBC:  Recent Labs Lab 05/17/14 2303 05/17/14 2315 05/18/14 1905 05/19/14 0545  WBC 7.9  --  9.7 9.0  NEUTROABS  --   --  7.4 6.5  HGB 15.9 16.7 14.9 14.2  HCT 45.3 49.0 41.9 41.5  MCV 85.6  --  84.3 85.4  PLT 191  --  186 196   Cardiac Enzymes: No results found for this basename: CKTOTAL, CKMB, CKMBINDEX, TROPONINI,  in the last 168 hours BNP (last 3 results) No results found for this basename: PROBNP,  in the last 8760 hours CBG: No results found for this basename: GLUCAP,  in the last 168 hours  No results found for this or any previous visit (from the past 240  hour(s)).    Studies: Dg Orthopantogram  05/19/2014   CLINICAL DATA:  23 year old male with right upper jaw, gum line pain. Severe right side sinus infection with facial swelling. Query odontogenic source of infection. Initial encounter.  EXAM: ORTHOPANTOGRAM/PANORAMIC  COMPARISON:  Orbit CT 05/18/2014.  FINDINGS: Asymmetric opacification of the right maxillary sinus is evident, as seen on the comparison. Sequelae of dental fillings noted. No dental caries or periapical lucency identified. Mandible appears intact.  IMPRESSION: No findings to suggest odontogenic source of right sinus/face infection.   Electronically Signed   By: Augusto GambleLee  Hall M.D.   On: 05/19/2014 08:36   Ct Orbits W/cm  05/18/2014   CLINICAL DATA:  Pain and swelling around the right eye for 1 day. No known injury.  EXAM: CT ORBITS WITH CONTRAST  TECHNIQUE: Multidetector CT imaging of the orbits was performed following the bolus administration of intravenous contrast.  CONTRAST:  100mL OMNIPAQUE IOHEXOL 300 MG/ML  SOLN  COMPARISON:  None.  FINDINGS: There is soft tissue swelling around the right orbit, which is predominately preseptal. There is subtle fat infiltration in the postseptal compartment, mainly extraconal. The right superior oblique muscle belly is asymmetrically thickened. No subperiosteal abscess. Patent superior ophthalmic vein and imaged angular facial vein. There is sinusitis with fluid levels on the right more than left. No visible bony breakdown. No evidence of acute globe abnormality. The patient is status post scleral banding on the left. The image intracranial compartment is unremarkable.  IMPRESSION: 1. Pre and postseptal right orbital cellulitis. Postseptal inflammation is centered around the superior oblique. No abscess or venous occlusion. 2. Multi focal sinusitis, including the right maxillary and ethmoid sinuses.   Electronically Signed   By: Tiburcio PeaJonathan  Watts M.D.   On: 05/18/2014 01:05        Scheduled Meds: .  enoxaparin (LOVENOX) injection  40 mg Subcutaneous QHS  . ibuprofen  600 mg Oral TID  . piperacillin-tazobactam (ZOSYN)  IV  3.375 g Intravenous Q8H  . vancomycin  1,000 mg Intravenous Q8H   Continuous Infusions:   Principal Problem:   Acute sinusitis Active Problems:   Periorbital cellulitis of right eye   Asthma    Time spent: 25 minutes.    Marcellus ScottHONGALGI,Haylynn Pha, MD, FACP, FHM. [facility] Pager [phone]  If 7PM-7AM, please contact night-coverage [email or web address] Password [email or web address] 05/19/2014, 3:53 PM    LOS: 1 day

## 2014-05-20 DIAGNOSIS — H0019 Chalazion unspecified eye, unspecified eyelid: Secondary | ICD-10-CM

## 2014-05-20 DIAGNOSIS — L03211 Cellulitis of face: Secondary | ICD-10-CM

## 2014-05-20 DIAGNOSIS — L0201 Cutaneous abscess of face: Secondary | ICD-10-CM

## 2014-05-20 LAB — BASIC METABOLIC PANEL
Anion gap: 12 (ref 5–15)
BUN: 9 mg/dL (ref 6–23)
CO2: 28 meq/L (ref 19–32)
Calcium: 9.1 mg/dL (ref 8.4–10.5)
Chloride: 102 mEq/L (ref 96–112)
Creatinine, Ser: 0.99 mg/dL (ref 0.50–1.35)
GFR calc Af Amer: >90 mL/min (ref >90)
GLUCOSE: 109 mg/dL — AB (ref 70–99)
Potassium: 3.9 mEq/L (ref 3.7–5.3)
Sodium: 142 mEq/L (ref 137–147)

## 2014-05-20 LAB — VANCOMYCIN, TROUGH: Vancomycin Tr: 11.6 ug/mL (ref 10.0–20.0)

## 2014-05-20 MED ORDER — VANCOMYCIN HCL 10 G IV SOLR
1250.0000 mg | Freq: Three times a day (TID) | INTRAVENOUS | Status: DC
  Filled 2014-05-20: qty 1250

## 2014-05-20 MED ORDER — AMOXICILLIN-POT CLAVULANATE 875-125 MG PO TABS: 1.0000 | ORAL_TABLET | Freq: Two times a day (BID) | ORAL | Status: AC

## 2014-05-20 MED ORDER — AMOXICILLIN-POT CLAVULANATE 875-125 MG PO TABS: 1.0000 | ORAL_TABLET | Freq: Two times a day (BID) | ORAL | Status: DC

## 2014-05-20 NOTE — Care Management Note (Signed)
    Page 1 of 1   05/20/2014     12:02:22 PM CARE MANAGEMENT NOTE 05/20/2014  Patient:  Michael Clayton,Michael Clayton   Account Number:  [id number]  Date Initiated:  05/19/2014  Documentation initiated by:  [facility]  Subjective/Objective Assessment:   23 year old male admitted with acute sinusitis.     Action/Plan:   From home.   Anticipated DC Date:  05/22/2014   Anticipated DC Plan:  HOME/SELF CARE      DC Planning Services  CM consult  MATCH Program  Medication Assistance  [facility]igent [facility]  PCP issues      Choice offered to / List presented to:             Status of service:  Completed, signed off Medicare Important Message given?   (If response is "NO", the following Medicare IM given date fields will be blank) Date Medicare IM given:   Medicare IM given by:   Date Additional Medicare IM given:   Additional Medicare IM given by:    Discharge Disposition:  HOME/SELF CARE  Per UR Regulation:  Reviewed for med. necessity/level of care/duration of stay  If discussed at Long Length of Stay Meetings, dates discussed:    Comments:  05/20/14 11:55 CM spoke with pt on phone to educate him to the parameters of the MATCH asst program.  Pt verbalizes understanding.  CM also [facility]  [facility] [address].  Pt verbalizes importance of followup.  MATCH letter and list of participating pharmacies faxed to Pt's RN who will reinforce aforementioned.  No other CM needs were communicated.  Freddy Jakschsarah Pierce Biagini, BSN, CM [phone].

## 2014-05-20 NOTE — Progress Notes (Signed)
ANTIBIOTIC CONSULT NOTE - Follow Up  Pharmacy Consult for Vancomycin and Zosyn Indication: Periorbital cellulitis  No Known Allergies  Patient Measurements: Height: 6' (182.9 cm) Weight: 182 lb (82.555 kg) IBW/kg (Calculated) : 77.6   Vital Signs: Temp: 98.4 F (36.9 C) (07/18 0700) Temp src: Oral (07/18 0700) BP: 133/76 mmHg (07/18 0700) Pulse Rate: 71 (07/18 0700) Intake/Output from previous day: 07/17 0701 - 07/18 0700 In: 750 [IV Piggyback:750] Out: 400 [Urine:400] Intake/Output from this shift:    Labs:  Recent Labs  05/17/14 2303  05/17/14 2315 05/18/14 1905 05/19/14 0545 05/20/14 0655  WBC 7.9  --   --  9.7 9.0  --   HGB 15.9  --  16.7 14.9 14.2  --   PLT 191  --   --  186 196  --   CREATININE  --   < > 1.10 1.08 1.19 0.99  < > = values in this interval not displayed. Estimated Creatinine Clearance: 125.2 ml/min (by C-G formula based on Cr of 0.99).  Recent Labs  05/20/14 0655  VANCOTROUGH 11.6     Microbiology: No results found for this or any previous visit (from the past 720 hour(s)).  Medical History: Past Medical History  Diagnosis Date  . Asthma     Medications:  Scheduled:  . enoxaparin (LOVENOX) injection  40 mg Subcutaneous QHS  . piperacillin-tazobactam (ZOSYN)  IV  3.375 g Intravenous Q8H  . vancomycin  1,000 mg Intravenous Q8H   Infusions:    Assessment: 23 yo with hx asthma c/o ha in the right frontal lobe x 3 days with fever and chills.  Pt with possible preseptal and post-septal orbital cellulitis.  Vancomycin and Zosyn per Rx for perorbital cellulitis.   7/16 >> vancomycin >> 7/16 >> Zosyn >>  Tmax: afebrile WBC: WNL Renal: SCr stable, CrCl > 100 Vanc Trough: 11.6 on 1g IV q8 subtherapeutic  Goal of Therapy:  Vancomycin trough level 15-20 mcg/ml  Plan:  1) Change vancomycin to 1250mg  IV q8 due to low trough 2) Continue current dose of Zosyn   Hessie KnowsJustin M Dionne Rossa, PharmD, BCPS Pager [phone] 05/20/2014 8:19 AM

## 2014-05-20 NOTE — Discharge Summary (Signed)
Physician Discharge Summary  Michael Clayton ZOX:[id number] DOB: December 06, 1990 DOA: 05/18/2014  PCP: Default, Provider, MD  Admit date: 05/18/2014 Discharge date: 05/20/2014  Time spent:>30 minutes  Recommendations for Outpatient Follow-up:  Follow clinical response and determine if he will need chronic therapy for sinusitis  Discharge Diagnoses:  Principal Problem:   Acute sinusitis Active Problems:   Periorbital cellulitis of right eye   Asthma   Chalazion of right upper eyelid   Discharge Condition: stable and improved. Discharge home with PO antibiotics and outpatient follow upn by ENT and ophthalmology  Diet recommendation: regular diet  Filed Weights   05/18/14 2203  Weight: 82.555 kg (182 lb)    History of present illness:  23 y.o. male with history of asthma started to experience headache in the right frontal area 3 days prior to admission, with subjective feeling of fever and chills. Patient also had some sinus congestion. 2 days prior to admission he  noticed his right eye becoming swollen and had come to the ER where he had CT orbit done which showed possible preseptal and post-septal orbital cellulitis. Patient was started on antibiotics and ophthalmology consult was obtained and patient had followed up with ophthalmologist as outpatient. Dr. Gwen PoundsKowalski saw the patient in his office and advised patient to come back to the ER because he needs IV antibiotics for his sinusitis and to be seen by ENT in consultation. Dr. Pollyann Kennedyosen ENT surgeon saw the patient and recommends IV antibiotics; there is no abscess appreciated and not surgery anticipated.   Hospital Course:  1-Acute sinusitis, right >left and? Facial cellulitis:  -doing good now -will start augmentin BID and treat for a total of 10 days -continue ibuprofen for pain and inflammation -follow up with ENT as an outpatient  2-Right upper eyelid edema secondary to chalazion: Ophthalmology input appreciated. -continue treatment  for cellulitis -outpatient follow up for chalazion  3- History of asthma: Stable -continue PRN albuterol  Procedures: See below for x-ray reports  Consultations:  ENT  Ophthalmology   Discharge Exam: Filed Vitals:   05/20/14 0700  BP: 133/76  Pulse: 71  Temp: 98.4 F (36.9 C)  Resp: 18    General: feeling better, afebrile, no nausea or vomiting; Denies blurred vision. Right eye is still itching some Cardiovascular: S1 and S2, no rubs or gallops Respiratory:CTA bilaterally Abdomen: soft, NT, ND, positive BS  Discharge Instructions You were cared for by a hospitalist during your hospital stay. If you have any questions about your discharge medications or the care you received while you were in the hospital after you are discharged, you can call the unit and asked to speak with the hospitalist on call if the hospitalist that took care of you is not available. Once you are discharged, your primary care physician will handle any further medical issues. Please note that NO REFILLS for any discharge medications will be authorized once you are discharged, as it is imperative that you return to your primary care physician (or establish a relationship with a primary care physician if you do not have one) for your aftercare needs so that they can reassess your need for medications and monitor your lab values.  Discharge Instructions   Discharge instructions    Complete by:  As directed   Avoid scratching or touching eyes Follow up with ophthalmology service and ENT as needed Take medications as prescribed Keep yourself well hydrated            Medication List    STOP  taking these medications       levofloxacin 500 MG tablet  Commonly known as:  LEVAQUIN      TAKE these medications       ADVIL 200 MG tablet  Generic drug:  ibuprofen  Take 800 mg by mouth every 6 (six) hours as needed (for pain).     albuterol 108 (90 BASE) MCG/ACT inhaler  Commonly known as:  PROVENTIL  HFA;VENTOLIN HFA  Inhale 2 puffs into the lungs daily as needed for wheezing or shortness of breath. For shortness of breath     amoxicillin-clavulanate 875-125 MG per tablet  Commonly known as:  AUGMENTIN  Take 1 tablet by mouth 2 (two) times daily.     diphenhydrAMINE 25 MG tablet  Commonly known as:  BENADRYL  Take 50 mg by mouth every 6 (six) hours as needed (for symptoms).     oxyCODONE-acetaminophen 5-325 MG per tablet  Commonly known as:  PERCOCET/ROXICET  Take 1-2 tablets by mouth every 6 (six) hours as needed for severe pain.     pseudoephedrine 60 MG tablet  Commonly known as:  SUDAFED  Take 120 mg by mouth every 4 (four) hours as needed for congestion.     TYLENOL COLD HEAD CONGESTION 03-04-09-325 MG Tabs  Generic drug:  Phenyleph-CPM-DM-APAP  Take 2 tablets by mouth as needed (for sinus pain.).       No Known Allergies     Follow-up Information   Follow up with [facility]    . (Go to the clinic on any morning Monday through Friday from 09:00-10:00 and they will assign you to a primary care physician, help you get insurance, and set you up for follow up visits)    Contact information:   [address] [place] [address] [phone]      Follow up with Trish Fountain, MD. (call office to set up follow up appointment)    Specialty:  Ophthalmology   Contact information:   [address] [place] [address] [phone]       Follow up with Serena Colonel, MD. (call office for appointment visit)    Specialty:  Otolaryngology   Contact information:   [address] [place] [address] [phone]        The results of significant diagnostics from this hospitalization (including imaging, microbiology, ancillary and laboratory) are listed below for reference.    Significant Diagnostic Studies: Dg Orthopantogram  05/19/2014   CLINICAL DATA:  23 year old male with right upper jaw, gum  line pain. Severe right side sinus infection with facial swelling. Query odontogenic source of infection. Initial encounter.  EXAM: ORTHOPANTOGRAM/PANORAMIC  COMPARISON:  Orbit CT 05/18/2014.  FINDINGS: Asymmetric opacification of the right maxillary sinus is evident, as seen on the comparison. Sequelae of dental fillings noted. No dental caries or periapical lucency identified. Mandible appears intact.  IMPRESSION: No findings to suggest odontogenic source of right sinus/face infection.   Electronically Signed   By: Augusto Gamble M.D.   On: 05/19/2014 08:36   Ct Orbits W/cm  05/18/2014   CLINICAL DATA:  Pain and swelling around the right eye for 1 day. No known injury.  EXAM: CT ORBITS WITH CONTRAST  TECHNIQUE: Multidetector CT imaging of the orbits was performed following the bolus administration of intravenous contrast.  CONTRAST:  OMNIPAQUE IOHEXOL 300 MG/ML  SOLN  COMPARISON:  None.  FINDINGS: There is soft tissue swelling around the right orbit, which is  predominately preseptal. There is subtle fat infiltration in the postseptal compartment, mainly extraconal. The right superior oblique muscle belly is asymmetrically thickened. No subperiosteal abscess. Patent superior ophthalmic vein and imaged angular facial vein. There is sinusitis with fluid levels on the right more than left. No visible bony breakdown. No evidence of acute globe abnormality. The patient is status post scleral banding on the left. The image intracranial compartment is unremarkable.  IMPRESSION: 1. Pre and postseptal right orbital cellulitis. Postseptal inflammation is centered around the superior oblique. No abscess or venous occlusion. 2. Multi focal sinusitis, including the right maxillary and ethmoid sinuses.   Electronically Signed   By: Tiburcio Pea M.D.   On: 05/18/2014 01:05   Labs: Basic Metabolic Panel:  Recent Labs Lab 05/17/14 2315 05/18/14 1905 05/19/14 0545 05/20/14 0655  NA 135* 134* 137 142  K 4.6 3.5*  3.7 3.9  CL 97 97 100 102  CO2  --  23 24 28   GLUCOSE 74 90 100* 109*  BUN 13 9 7 9   CREATININE 1.10 1.08 1.19 0.99  CALCIUM  --  9.6 9.1 9.1   Liver Function Tests:  Recent Labs Lab 05/19/14 0545  AST 9  ALT 8  ALKPHOS 57  BILITOT 1.6*  PROT 6.8  ALBUMIN 2.9*   CBC:  Recent Labs Lab 05/17/14 2303 05/17/14 2315 05/18/14 1905 05/19/14 0545  WBC 7.9  --  9.7 9.0  NEUTROABS  --   --  7.4 6.5  HGB 15.9 16.7 14.9 14.2  HCT 45.3 49.0 41.9 41.5  MCV 85.6  --  84.3 85.4  PLT 191  --  186 196   Signed:  Vassie Loll  [facility] 05/20/2014, 12:14 PM

## 2014-05-22 ENCOUNTER — Ambulatory Visit: Payer: Self-pay | Attending: Internal Medicine | Admitting: Internal Medicine

## 2014-05-22 ENCOUNTER — Encounter: Payer: Self-pay | Admitting: Internal Medicine

## 2014-05-22 VITALS — BP 117/75 | HR 75 | Temp 97.5°F | Resp 16 | Ht 72.0 in | Wt 182.8 lb

## 2014-05-22 DIAGNOSIS — J45909 Unspecified asthma, uncomplicated: Secondary | ICD-10-CM | POA: Insufficient documentation

## 2014-05-22 DIAGNOSIS — Z79899 Other long term (current) drug therapy: Secondary | ICD-10-CM | POA: Insufficient documentation

## 2014-05-22 DIAGNOSIS — J019 Acute sinusitis, unspecified: Secondary | ICD-10-CM | POA: Insufficient documentation

## 2014-05-22 DIAGNOSIS — Z87891 Personal history of nicotine dependence: Secondary | ICD-10-CM | POA: Insufficient documentation

## 2014-05-22 DIAGNOSIS — R197 Diarrhea, unspecified: Secondary | ICD-10-CM | POA: Insufficient documentation

## 2014-05-22 NOTE — Patient Instructions (Signed)
Sinusitis Sinusitis is redness, soreness, and swelling (inflammation) of the paranasal sinuses. Paranasal sinuses are air pockets within the bones of your face (beneath the eyes, the middle of the forehead, or above the eyes). In healthy paranasal sinuses, mucus is able to drain out, and air is able to circulate through them by way of your nose. However, when your paranasal sinuses are inflamed, mucus and air can become trapped. This can allow bacteria and other germs to grow and cause infection. Sinusitis can develop quickly and last only a short time (acute) or continue over a long period (chronic). Sinusitis that lasts for more than 12 weeks is considered chronic.  CAUSES  Causes of sinusitis include:  Allergies.  Structural abnormalities, such as displacement of the cartilage that separates your nostrils (deviated septum), which can decrease the air flow through your nose and sinuses and affect sinus drainage.  Functional abnormalities, such as when the small hairs (cilia) that line your sinuses and help remove mucus do not work properly or are not present. SYMPTOMS  Symptoms of acute and chronic sinusitis are the same. The primary symptoms are pain and pressure around the affected sinuses. Other symptoms include:  Upper toothache.  Earache.  Headache.  Bad breath.  Decreased sense of smell and taste.  A cough, which worsens when you are lying flat.  Fatigue.  Fever.  Thick drainage from your nose, which often is green and may contain pus (purulent).  Swelling and warmth over the affected sinuses. DIAGNOSIS  Your caregiver will perform a physical exam. During the exam, your caregiver may:  Look in your nose for signs of abnormal growths in your nostrils (nasal polyps).  Tap over the affected sinus to check for signs of infection.  View the inside of your sinuses (endoscopy) with a special imaging device with a light attached (endoscope), which is inserted into your  sinuses. If your caregiver suspects that you have chronic sinusitis, one or more of the following tests may be recommended:  Allergy tests.  Nasal culture--A sample of mucus is taken from your nose and sent to a lab and screened for bacteria.  Nasal cytology--A sample of mucus is taken from your nose and examined by your caregiver to determine if your sinusitis is related to an allergy. TREATMENT  Most cases of acute sinusitis are related to a viral infection and will resolve on their own within 10 days. Sometimes medicines are prescribed to help relieve symptoms (pain medicine, decongestants, nasal steroid sprays, or saline sprays).  However, for sinusitis related to a bacterial infection, your caregiver will prescribe antibiotic medicines. These are medicines that will help kill the bacteria causing the infection.  Rarely, sinusitis is caused by a fungal infection. In theses cases, your caregiver will prescribe antifungal medicine. For some cases of chronic sinusitis, surgery is needed. Generally, these are cases in which sinusitis recurs more than 3 times per year, despite other treatments. HOME CARE INSTRUCTIONS   Drink plenty of water. Water helps thin the mucus so your sinuses can drain more easily.  Use a humidifier.  Inhale steam 3 to 4 times a day (for example, sit in the bathroom with the shower running).  Apply a warm, moist washcloth to your face 3 to 4 times a day, or as directed by your caregiver.  Use saline nasal sprays to help moisten and clean your sinuses.  Take over-the-counter or prescription medicines for pain, discomfort, or fever only as directed by your caregiver. SEEK IMMEDIATE MEDICAL CARE IF:    You have increasing pain or severe headaches.  You have nausea, vomiting, or drowsiness.  You have swelling around your face.  You have vision problems.  You have a stiff neck.  You have difficulty breathing. MAKE SURE YOU:   Understand these  instructions.  Will watch your condition.  Will get help right away if you are not doing well or get worse. Document Released: 10/20/2005 Document Revised: 01/12/2012 Document Reviewed: 11/04/2011 Exit[employer] Patient Information 2015 [employer]. This information is not intended to replace advice given to you by your health care provider. Make sure you discuss any questions you have with your health care provider.  

## 2014-05-22 NOTE — Progress Notes (Signed)
Patient ID: Michael Clayton, male   DOB: 21-May-1991, 23 y.o.   MRN: [id number]  [id number]  [id number]  DOB - 21-May-1991  CC:  Chief Complaint  Patient presents with  . Hospitalization Follow-up  . Establish Care  . Sinusitis  . Asthma       HPI: Michael Clayton is a 23 y.o. male here today to establish medical care.  Patient presents today as a hospital follow up for acute sinusitis.  Patient states that he has been taking the Augmentin as prescribed but has had some diarrhea since beginning medication 2 days again.  Patient reports that he feels significantly better since hospital discharge.  Facial swelling has resolved.  Patient reports that he was seen by ENT and opthalmalogy and was told that it was a really bad sinus infection and that he does not need to continue care with them.     No Known Allergies Past Medical History  Diagnosis Date  . Asthma    Current Outpatient Prescriptions on File Prior to Visit  Medication Sig Dispense Refill  . albuterol (PROVENTIL HFA;VENTOLIN HFA) 108 (90 BASE) MCG/ACT inhaler Inhale 2 puffs into the lungs daily as needed for wheezing or shortness of breath. For shortness of breath      . amoxicillin-clavulanate (AUGMENTIN) 875-125 MG per tablet Take 1 tablet by mouth 2 (two) times daily.  20 tablet  0  . ibuprofen (ADVIL) 200 MG tablet Take 800 mg by mouth every 6 (six) hours as needed (for pain).      Marland Kitchen. oxyCODONE-acetaminophen (PERCOCET/ROXICET) 5-325 MG per tablet Take 1-2 tablets by mouth every 6 (six) hours as needed for severe pain.  20 tablet  0  . diphenhydrAMINE (BENADRYL) 25 MG tablet Take 50 mg by mouth every 6 (six) hours as needed (for symptoms).      . Phenyleph-CPM-DM-APAP (TYLENOL COLD HEAD CONGESTION) 03-04-09-325 MG TABS Take 2 tablets by mouth as needed (for sinus pain.).      Marland Kitchen. pseudoephedrine (SUDAFED) 60 MG tablet Take 120 mg by mouth every 4 (four) hours as needed for congestion.       No current facility-administered  medications on file prior to visit.   Family History  Problem Relation Age of Onset  . Diabetes Mellitus II Neg Hx    History   Social History  . Marital Status: Single    Spouse Name: N/A    Number of Children: N/A  . Years of Education: N/A   Occupational History  . Not on file.   Social History Main Topics  . Smoking status: Former Smoker -- 0.20 packs/day for 1 years    Types: Cigarettes    Start date: 05/23/2007    Quit date: 05/22/2008  . Smokeless tobacco: Never Used  . Alcohol Use: 1.5 oz/week    3 drink(s) per week     Comment: social  . Drug Use: No  . Sexual Activity: Not on file   Other Topics Concern  . Not on file   Social History Narrative  . No narrative on file   Review of Systems  Constitutional: Positive for diaphoresis (night sweats). Negative for fever and chills.  Eyes: Negative.   Respiratory: Negative.   Cardiovascular: Negative.   Gastrointestinal: Positive for diarrhea. Negative for nausea, vomiting and abdominal pain.  Skin: Negative.   Neurological: Positive for headaches.     Objective:   Filed Vitals:   05/22/14 1027  BP: 117/75  Pulse: 75  Temp: 97.5 F (36.4 C)  Resp: 16    Physical Exam: Constitutional: Patient appears well-developed and well-nourished. No distress. HENT: Normocephalic, atraumatic, External right and left ear normal. Oropharynx is clear and moist. Some frontal and maxillary sinus tenderness to palpitation.  Eyes: Conjunctivae and EOM are normal. PERRLA, no scleral icterus. Neck: Normal ROM. Neck supple. No JVD. No tracheal deviation. No thyromegaly. CVS: RRR, S1/S2 +, no murmurs, no gallops, no carotid bruit.  Pulmonary: Effort and breath sounds normal, no stridor, rhonchi, wheezes, rales.  Abdominal: Soft. BS +, no distension, tenderness, rebound or guarding.  Musculoskeletal: Normal range of motion. No edema and no tenderness.  Lymphadenopathy: No lymphadenopathy noted, cervical, inguinal or  axillary Neuro: Alert. Normal reflexes, muscle tone coordination. No cranial nerve deficit. Skin: Skin is warm and dry. No rash noted. Not diaphoretic. No erythema. No pallor. Psychiatric: Normal mood and affect. Behavior, judgment, thought content normal.  Lab Results  Component Value Date   WBC 9.0 05/19/2014   HGB 14.2 05/19/2014   HCT 41.5 05/19/2014   MCV 85.4 05/19/2014   PLT 196 05/19/2014   Lab Results  Component Value Date   CREATININE 0.99 05/20/2014   BUN 9 05/20/2014   NA 142 05/20/2014   K 3.9 05/20/2014   CL 102 05/20/2014   CO2 28 05/20/2014    No results found for this basename: HGBA1C   Lipid Panel  No results found for this basename: chol, trig, hdl, cholhdl, vldl, ldlcalc       Assessment and plan:   Michael Clayton was seen today for hospitalization follow-up, establish care, sinusitis and asthma.  Diagnoses and associated orders for this visit:  Acute sinusitis, recurrence not specified, unspecified location Explained that patient should continue Augmentin as prescribed and continue medication even if he begins to feel better. Explained to patient to monitor the diarrhea and gave warning signs of when to RTC.    Return if symptoms worsen or fail to improve.  The patient was given clear instructions to go to ER or return to medical center if symptoms don't improve, worsen or new problems develop. The patient verbalized understanding.    Holland Commons, NP-C [facility] [phone] 05/23/2014, 10:54 AM

## 2014-05-22 NOTE — Progress Notes (Signed)
Patient presents to establish care HFU for sinusitis and asthma States feeling "a lot better." PHQ-9 score of 9, however, he was concerned that answering questions might prevent him from buying gun in future. Referred to Clinical Child psychotherapistocial Worker. Provider aware.

## 2014-05-23 ENCOUNTER — Encounter: Payer: Self-pay | Admitting: Internal Medicine

## 2014-05-24 ENCOUNTER — Inpatient Hospital Stay: Payer: Self-pay | Admitting: Internal Medicine

## 2014-06-16 ENCOUNTER — Ambulatory Visit: Payer: Self-pay

## 2014-07-18 ENCOUNTER — Ambulatory Visit: Payer: Self-pay

## 2016-06-08 ENCOUNTER — Encounter (HOSPITAL_COMMUNITY): Payer: Self-pay | Admitting: *Deleted

## 2016-06-08 ENCOUNTER — Emergency Department (HOSPITAL_COMMUNITY)
Admission: EM | Admit: 2016-06-08 | Discharge: 2016-06-08 | Disposition: A | Discharge status: Home / Self Care | Payer: Self-pay | Attending: Emergency Medicine | Admitting: Emergency Medicine

## 2016-06-08 DIAGNOSIS — R0602 Shortness of breath: Secondary | ICD-10-CM

## 2016-06-08 DIAGNOSIS — Z7951 Long term (current) use of inhaled steroids: Secondary | ICD-10-CM | POA: Insufficient documentation

## 2016-06-08 DIAGNOSIS — R0789 Other chest pain: Secondary | ICD-10-CM

## 2016-06-08 DIAGNOSIS — Z87891 Personal history of nicotine dependence: Secondary | ICD-10-CM | POA: Insufficient documentation

## 2016-06-08 DIAGNOSIS — J45901 Unspecified asthma with (acute) exacerbation: Secondary | ICD-10-CM | POA: Insufficient documentation

## 2016-06-08 DIAGNOSIS — R062 Wheezing: Secondary | ICD-10-CM

## 2016-06-08 MED ORDER — PREDNISONE 20 MG PO TABS: ORAL_TABLET | ORAL | 0 refills | Status: DC

## 2016-06-08 MED ORDER — ALBUTEROL SULFATE HFA 108 (90 BASE) MCG/ACT IN AERS
2.0000 | INHALATION_SPRAY | Freq: Once | RESPIRATORY_TRACT | Status: AC
  Administered 2016-06-08: 2 via RESPIRATORY_TRACT
  Filled 2016-06-08: qty 6.7

## 2016-06-08 MED ORDER — ALBUTEROL SULFATE (2.5 MG/3ML) 0.083% IN NEBU: 2.5000 mg | INHALATION_SOLUTION | RESPIRATORY_TRACT | 0 refills | Status: DC | PRN

## 2016-06-08 MED ORDER — ALBUTEROL SULFATE (2.5 MG/3ML) 0.083% IN NEBU
5.0000 mg | INHALATION_SOLUTION | Freq: Once | RESPIRATORY_TRACT | Status: AC
  Administered 2016-06-08: 5 mg via RESPIRATORY_TRACT
  Filled 2016-06-08: qty 6

## 2016-06-08 MED ORDER — IPRATROPIUM BROMIDE 0.02 % IN SOLN: 0.5000 mg | Freq: Four times a day (QID) | RESPIRATORY_TRACT | 0 refills | Status: DC | PRN

## 2016-06-08 MED ORDER — PREDNISONE 20 MG PO TABS
60.0000 mg | ORAL_TABLET | Freq: Once | ORAL | Status: AC
  Administered 2016-06-08: 60 mg via ORAL
  Filled 2016-06-08: qty 3

## 2016-06-08 NOTE — Discharge Instructions (Signed)
Continue to stay well-hydrated. Continue to alternate between Tylenol and Ibuprofen for pain or fever. Use Mucinex for cough suppression/expectoration of mucus. Use netipot and flonase to help with nasal congestion. Start taking over the counter antihistamines like claritin/zyrtec/etc to decrease asthma symptoms. Use inhaler and/or nebulizer as directed, as needed for cough/chest congestion/wheezing (for the nebulizer medications, you were given two different medications, atrovent and albuterol-- when using them, you can just combine the solutions together in the nebulizer machine when you use it, no need to separate them into different treatments). Followup with [facility] in 5-7 days for recheck of ongoing symptoms and to establish medical care. Return to emergency department for emergent changing or worsening of symptoms.

## 2016-06-08 NOTE — ED Triage Notes (Signed)
Pt with hx of asthma complains of wheezing, shortness of breath since yesterday. Pt states he did a breathing treatment yesterday but feels he needs steroids.

## 2016-06-08 NOTE — ED Notes (Signed)
Pt ambulated out of department without any difficulties.  No reaction to medication suspected.

## 2016-06-08 NOTE — ED Provider Notes (Signed)
[facility]-EMERGENCY DEPT Provider Note   CSN: [id number] Arrival date & time: 06/08/16  1022  First Provider Contact:  First MD Initiated Contact with Patient 06/08/16 1144        History   Chief Complaint No chief complaint on file.   HPI Michael Clayton is a 25 y.o. male with a PMHx of asthma, who presents to the ED with complaints of asthma exacerbation 2 days. He reports that he has had asthma since he was 25 years old, and this feels exactly the same as all of his prior asthma attacks. His symptoms include wheezing, shortness of breath, and chest tightness. Symptoms worsen with being outside. He reports he has been using his albuterol inhaler and a nebulizer machine with albuterol solution with minimal improvement that is very brief before his symptoms return. Had a duoneb in triage which he feels helped. He states he feels like he needs prednisone. No PCP at this time, but working on getting one.   He denies any fevers, chills, cough, rhinorrhea, chest pain, leg swelling, recent travel/surgery/immobilization, family or personal history of DVT/PE, abdominal pain, nausea, vomiting, diarrhea, consultation, dysuria, hematuria, numbness, tingling, focal weakness, or sick contacts.   The history is provided by the patient and medical records. No language interpreter was used.    Past Medical History:  Diagnosis Date  . Asthma     Patient Active Problem List   Diagnosis Date Noted  . Chalazion of right upper eyelid 05/19/2014  . Periorbital cellulitis of right eye 05/18/2014  . Acute sinusitis 05/18/2014  . Asthma 05/18/2014    Past Surgical History:  Procedure Laterality Date  . EYE SURGERY Left        Home Medications    Prior to Admission medications   Medication Sig Start Date End Date Taking? Authorizing Provider  albuterol (PROVENTIL HFA;VENTOLIN HFA) 108 (90 BASE) MCG/ACT inhaler Inhale 2 puffs into the lungs daily as needed for wheezing or shortness of breath. For  shortness of breath    Historical Provider, MD  diphenhydrAMINE (BENADRYL) 25 MG tablet Take 50 mg by mouth every 6 (six) hours as needed (for symptoms).    Historical Provider, MD  ibuprofen (ADVIL) 200 MG tablet Take 800 mg by mouth every 6 (six) hours as needed (for pain).    Historical Provider, MD  oxyCODONE-acetaminophen (PERCOCET/ROXICET) 5-325 MG per tablet Take 1-2 tablets by mouth every 6 (six) hours as needed for severe pain. 05/18/14   Earley Favor, NP  Phenyleph-CPM-DM-APAP (TYLENOL COLD HEAD CONGESTION) 03-04-09-325 MG TABS Take 2 tablets by mouth as needed (for sinus pain.).    Historical Provider, MD  pseudoephedrine (SUDAFED) 60 MG tablet Take 120 mg by mouth every 4 (four) hours as needed for congestion.    Historical Provider, MD    Family History Family History  Problem Relation Age of Onset  . Diabetes Mellitus II Neg Hx     Social History Social History  Substance Use Topics  . Smoking status: Former Smoker    Packs/day: 0.20    Years: 1.00    Types: Cigarettes    Start date: 05/23/2007    Quit date: 05/22/2008  . Smokeless tobacco: Never Used  . Alcohol use 1.5 oz/week    3 Standard drinks or equivalent per week     Comment: social     Allergies   Review of patient's allergies indicates no known allergies.   Review of Systems Review of Systems  Constitutional: Negative for chills and fever.  HENT: Negative for rhinorrhea.   Respiratory: Positive for chest tightness, shortness of breath and wheezing. Negative for cough.   Cardiovascular: Negative for chest pain and leg swelling.  Gastrointestinal: Negative for abdominal pain, constipation, diarrhea, nausea and vomiting.  Genitourinary: Negative for dysuria and hematuria.  Musculoskeletal: Negative for arthralgias and myalgias.  Skin: Negative for color change.  Allergic/Immunologic: Positive for environmental allergies. Negative for immunocompromised state.  Neurological: Negative for weakness and  numbness.  Psychiatric/Behavioral: Negative for confusion.   10 Systems reviewed and are negative for acute change except as noted in the HPI.   Physical Exam Updated Vital Signs BP 117/79 (BP Location: Left Arm)   Pulse 70   Temp 97.8 F (36.6 C) (Oral)   Resp 16   Ht 6' (1.829 m)   Wt 81.6 kg   SpO2 95%   BMI 24.41 kg/m   Physical Exam  Constitutional: He is oriented to person, place, and time. Vital signs are normal. He appears well-developed and well-nourished.  Non-toxic appearance. No distress.  Afebrile, nontoxic, NAD  HENT:  Head: Normocephalic and atraumatic.  Mouth/Throat: Oropharynx is clear and moist and mucous membranes are normal.  Eyes: Conjunctivae and EOM are normal. Right eye exhibits no discharge. Left eye exhibits no discharge.  Neck: Normal range of motion. Neck supple.  Cardiovascular: Normal rate, regular rhythm, normal heart sounds and intact distal pulses.  Exam reveals no gallop and no friction rub.   No murmur heard. RRR, nl s1/s2, no m/r/g, distal pulses intact, no pedal edema   Pulmonary/Chest: Effort normal. No respiratory distress. He has no decreased breath sounds. He has wheezes. He has no rhonchi. He has no rales.  Faint expiratory wheezing throughout (after duoneb in triage), no rhonchi/rales or focal consolidative lung sounds, no hypoxia or increased WOB, speaking in full sentences, SpO2 95% on RA   Abdominal: Soft. Normal appearance and bowel sounds are normal. He exhibits no distension. There is no tenderness. There is no rigidity, no rebound, no guarding, no CVA tenderness, no tenderness at McBurney's point and negative Murphy's sign.  Musculoskeletal: Normal range of motion.  Neurological: He is alert and oriented to person, place, and time. He has normal strength. No sensory deficit.  Skin: Skin is warm, dry and intact. No rash noted.  Psychiatric: He has a normal mood and affect.  Nursing note and vitals reviewed.    ED Treatments /  Results  Labs (all labs ordered are listed, but only abnormal results are displayed) Labs Reviewed - No data to display  EKG  EKG Interpretation None       Radiology No results found.  Procedures Procedures (including critical care time)  Medications Ordered in ED Medications  albuterol (PROVENTIL HFA;VENTOLIN HFA) 108 (90 Base) MCG/ACT inhaler 2 puff (not administered)  predniSONE (DELTASONE) tablet 60 mg (not administered)  albuterol (PROVENTIL) (2.5 MG/3ML) 0.083% nebulizer solution 5 mg (5 mg Nebulization Given 06/08/16 1105)     Initial Impression / Assessment and Plan / ED Course  I have reviewed the triage vital signs and the nursing notes.  Pertinent labs & imaging results that were available during my care of the patient were reviewed by me and considered in my medical decision making (see chart for details).  Clinical Course    25 y.o. male here with asthma exacerbation x2 days with symptoms of SOB, chest tightness, and wheezing. Using home inhaler (now out) and neb tx with some minimal improvement, feels he needs steroids. Lung exam reveals minimal  expiratory wheezing throughout, no focal consolidative lung sounds; this was after 1 duoneb given in triage. Doubt need for labs/imaging. Pt feels better, thinks he just wants prednisone and doesn't think he wants to get another duoneb here since he can do these at home. Will send home with inhaler, and rx neb solution to make duonebs at home. Will also send home with prednisone. Discussed using antihistamines daily for improvement of asthma symptoms. F/up with [facility] in 1wk to establish care and f/up from today's visit. I explained the diagnosis and have given explicit precautions to return to the ER including for any other new or worsening symptoms. The patient understands and accepts the medical plan as it's been dictated and I have answered their questions. Discharge instructions concerning home care and prescriptions have been  given. The patient is STABLE and is discharged to home in good condition.   Final Clinical Impressions(s) / ED Diagnoses   Final diagnoses:  Asthma exacerbation  SOB (shortness of breath)  Wheezing  Chest tightness    New Prescriptions New Prescriptions   ALBUTEROL (PROVENTIL) (2.5 MG/3ML) 0.083% NEBULIZER SOLUTION    Take 3 mLs (2.5 mg total) by nebulization every 4 (four) hours as needed for wheezing or shortness of breath.   IPRATROPIUM (ATROVENT) 0.02 % NEBULIZER SOLUTION    Take 2.5 mLs (0.5 mg total) by nebulization every 6 (six) hours as needed for wheezing or shortness of breath.   PREDNISONE (DELTASONE) 20 MG TABLET    2 tabs po daily x 4 days starting 06/09/16     Allen DerryMercedes Camprubi-Soms, PA-C 06/08/16 1206    Gerhard Munchobert Lockwood, MD 06/08/16 1540

## 2016-11-26 ENCOUNTER — Encounter (HOSPITAL_COMMUNITY): Payer: Self-pay | Admitting: Emergency Medicine

## 2016-11-26 ENCOUNTER — Emergency Department (HOSPITAL_COMMUNITY)
Admission: EM | Admit: 2016-11-26 | Discharge: 2016-11-26 | Disposition: A | Discharge status: Home / Self Care | Payer: Self-pay | Attending: Emergency Medicine | Admitting: Emergency Medicine

## 2016-11-26 DIAGNOSIS — K047 Periapical abscess without sinus: Secondary | ICD-10-CM | POA: Insufficient documentation

## 2016-11-26 DIAGNOSIS — J45909 Unspecified asthma, uncomplicated: Secondary | ICD-10-CM | POA: Insufficient documentation

## 2016-11-26 DIAGNOSIS — Z87891 Personal history of nicotine dependence: Secondary | ICD-10-CM | POA: Insufficient documentation

## 2016-11-26 MED ORDER — IBUPROFEN 800 MG PO TABS: 800.0000 mg | ORAL_TABLET | Freq: Three times a day (TID) | ORAL | 0 refills | Status: DC | PRN

## 2016-11-26 MED ORDER — PENICILLIN V POTASSIUM 500 MG PO TABS: 500.0000 mg | ORAL_TABLET | Freq: Four times a day (QID) | ORAL | 0 refills | Status: DC

## 2016-11-26 NOTE — ED Provider Notes (Signed)
[facility]-EMERGENCY DEPT Provider Note   CSN: [id number] Arrival date & time: 11/26/16  1425  By signing my name below, I, Michael Clayton, attest that this documentation has been prepared under the direction and in the presence of Michael Clayton, [place]. Electronically Signed: Teofilo Clayton, ED Scribe. 11/26/2016. 4:31 PM.    History   Chief Complaint Chief Complaint  Patient presents with  . Dental Pain    The history is provided by the patient. No language interpreter was used.   HPI Comments:  OVERTON Clayton is a 26 y.o. male who presents to the Emergency Department complaining of constant right lower tooth pain x 4 days. Pt states that the tooth is cracked and is painful, and states that the pain radiates in to his chin. Pt has been taking ibuprofen, and states that the ibuprofen stopped working today. Pt denies fever, chills, difficulty swallowing/breathing.    Past Medical History:  Diagnosis Date  . Asthma     Patient Active Problem List   Diagnosis Date Noted  . Chalazion of right upper eyelid 05/19/2014  . Periorbital cellulitis of right eye 05/18/2014  . Acute sinusitis 05/18/2014  . Asthma 05/18/2014    Past Surgical History:  Procedure Laterality Date  . EYE SURGERY Left        Home Medications    Prior to Admission medications   Medication Sig Start Date End Date Taking? Authorizing Provider  albuterol (PROVENTIL HFA;VENTOLIN HFA) 108 (90 BASE) MCG/ACT inhaler Inhale 2 puffs into the lungs daily as needed for wheezing or shortness of breath. For shortness of breath   Yes Historical Provider, MD  albuterol (PROVENTIL) (2.5 MG/3ML) 0.083% nebulizer solution Take 3 mLs (2.5 mg total) by nebulization every 4 (four) hours as needed for wheezing or shortness of breath. Patient not taking: Reported on 11/26/2016 06/08/16   Michael [address], PA-C  ibuprofen (ADVIL,MOTRIN) 800 MG tablet Take 1 tablet (800 mg total) by mouth every 8 (eight) hours as needed for  mild pain or moderate pain. 11/26/16   Michael Dredge, PA-C  ipratropium (ATROVENT) 0.02 % nebulizer solution Take 2.5 mLs (0.5 mg total) by nebulization every 6 (six) hours as needed for wheezing or shortness of breath. Patient not taking: Reported on 11/26/2016 06/08/16   Michael [address], PA-C  penicillin v potassium (VEETID) 500 MG tablet Take 1 tablet (500 mg total) by mouth 4 (four) times daily. 11/26/16   Michael Dredge, PA-C  pseudoephedrine (SUDAFED) 60 MG tablet Take 120 mg by mouth every 4 (four) hours as needed for congestion.    Historical Provider, MD    Family History Family History  Problem Relation Age of Onset  . Diabetes Mellitus II Neg Hx     Social History Social History  Substance Use Topics  . Smoking status: Former Smoker    Packs/day: 0.20    Years: 1.00    Types: Cigarettes    Start date: 05/23/2007    Quit date: 05/22/2008  . Smokeless tobacco: Never Used  . Alcohol use 1.5 oz/week    3 Standard drinks or equivalent per week     Comment: social     Allergies   Patient has no known allergies.   Review of Systems Review of Systems  Constitutional: Negative for chills and fever.  HENT: Positive for dental problem. Negative for sore throat and trouble swallowing.   Respiratory: Negative for shortness of breath, wheezing and stridor.   Musculoskeletal: Negative for neck pain and neck stiffness.  Allergic/Immunologic: Negative for immunocompromised state.  Psychiatric/Behavioral: Negative for self-injury.  All other systems reviewed and are negative.    Physical Exam Updated Vital Signs BP 122/78 (BP Location: Left Arm)   Pulse 62   Temp 97.8 F (36.6 C) (Oral)   Resp 16   SpO2 99%   Physical Exam  Constitutional: He appears well-developed and well-nourished. No distress.  HENT:  Head: Normocephalic and atraumatic.  Mouth/Throat: Uvula is midline and oropharynx is clear and moist. Mucous membranes are not dry. No uvula swelling. No oropharyngeal  exudate, posterior oropharyngeal edema, posterior oropharyngeal erythema or tonsillar abscesses.  Right lower 2nd bicuspid with remote fracture. Tenderness to percussion. Adjacent gingival nodule TTP.   Neck: Normal range of motion. Neck supple.  Cardiovascular: Normal rate.   Pulmonary/Chest: Effort normal and breath sounds normal. No stridor.  Lymphadenopathy:    He has no cervical adenopathy.  Neurological: He is alert.  Skin: He is not diaphoretic.  Nursing note and vitals reviewed.    ED Treatments / Results  DIAGNOSTIC STUDIES:  Oxygen Saturation is 99% on RA, normal by my interpretation.    COORDINATION OF CARE:  4:30 PM Will order antibiotics and will refer to a dentist. Discussed treatment plan with pt at bedside and pt agreed to plan.   Labs (all labs ordered are listed, but only abnormal results are displayed) Labs Reviewed - No data to display  EKG  EKG Interpretation None       Radiology No results found.  Procedures Procedures (including critical care time)  Medications Ordered in ED Medications - No data to display   Initial Impression / Assessment and Plan / ED Course  I have reviewed the triage vital signs and the nursing notes.  Pertinent labs & imaging results that were available during my care of the patient were reviewed by me and considered in my medical decision making (see chart for details).     Afebrile, nontoxic patient with new dental pain with likely early abscess.  No airway concerns. No e/o Ludwig's angina.  D/C home with antibiotic and dental follow up.  Discussed findings, treatment, and follow up  with patient.  Pt given return precautions.  Pt verbalizes understanding and agrees with plan.       Final Clinical Impressions(s) / ED Diagnoses   Final diagnoses:  Dental abscess    New Prescriptions Discharge Medication List as of 11/26/2016  4:35 PM    START taking these medications   Details  penicillin v potassium (VEETID)  500 MG tablet Take 1 tablet (500 mg total) by mouth 4 (four) times daily., Starting Wed 11/26/2016, Print        I personally performed the services described in this documentation, which was scribed in my presence. The recorded information has been reviewed and is accurate.     Michael Dredgemily Lolly Glaus, PA-C 11/26/16 2041    Benjiman CoreNathan Pickering, MD 11/27/16 [phone]

## 2016-11-26 NOTE — ED Triage Notes (Signed)
Per pt, states right lower tooth cracked-needs referral for endodontist-increased pain

## 2016-11-26 NOTE — ED Notes (Signed)
Patient was alert, oriented and stable upon discharge. RN went over AVS and patient had no further questions.  

## 2016-11-26 NOTE — Discharge Instructions (Signed)
Read the information below.  Use the prescribed medication as directed.  Please discuss all new medications with your pharmacist.  You may return to the Emergency Department at any time for worsening condition or any new symptoms that concern you.   If you develop fevers, swelling in your face, difficulty swallowing or breathing, return to the ER immediately for a recheck.   °

## 2016-11-26 NOTE — ED Notes (Signed)
Pt able to speak clearly and without obvious discomfort.

## 2016-12-23 ENCOUNTER — Emergency Department (HOSPITAL_COMMUNITY)
Admission: EM | Admit: 2016-12-23 | Discharge: 2016-12-23 | Disposition: A | Discharge status: Home / Self Care | Payer: Self-pay | Attending: Emergency Medicine | Admitting: Emergency Medicine

## 2016-12-23 ENCOUNTER — Encounter (HOSPITAL_COMMUNITY): Payer: Self-pay | Admitting: Emergency Medicine

## 2016-12-23 DIAGNOSIS — Z79899 Other long term (current) drug therapy: Secondary | ICD-10-CM | POA: Insufficient documentation

## 2016-12-23 DIAGNOSIS — K047 Periapical abscess without sinus: Secondary | ICD-10-CM | POA: Insufficient documentation

## 2016-12-23 DIAGNOSIS — K029 Dental caries, unspecified: Secondary | ICD-10-CM | POA: Insufficient documentation

## 2016-12-23 DIAGNOSIS — Z87891 Personal history of nicotine dependence: Secondary | ICD-10-CM | POA: Insufficient documentation

## 2016-12-23 DIAGNOSIS — J45909 Unspecified asthma, uncomplicated: Secondary | ICD-10-CM | POA: Insufficient documentation

## 2016-12-23 MED ORDER — PENICILLIN V POTASSIUM 500 MG PO TABS: 1000.0000 mg | ORAL_TABLET | Freq: Two times a day (BID) | ORAL | 0 refills | Status: DC

## 2016-12-23 NOTE — ED Triage Notes (Signed)
Patient states that he has abscess on right lower jaw that has been going on for about month. Patient states that he is out of antibiotics and needs more until he sees MD.

## 2016-12-23 NOTE — Discharge Instructions (Addendum)
Apply warm compresses to jaw throughout the day. Take antibiotic until finished. Alternate between tylenol and motrin as needed for pain. Perform salt water swishes to help with pain/swelling. Use over the counter oragel as needed for additional relief. Followup with a dentist is very important for ongoing evaluation and management of recurrent dental pain, follow up with your dentist on Thursday for your already scheduled visit. Return to emergency department for emergent changing or worsening symptoms.

## 2016-12-23 NOTE — ED Provider Notes (Signed)
[facility]-EMERGENCY DEPT Provider Note   CSN: [id number] Arrival date & time: 12/23/16  1818  By signing my name below, I, Modena Jansky, attest that this documentation has been prepared under the direction and in the presence of non-physician practitioner, [address] PA-C. Electronically Signed: Modena Jansky, Scribe. 12/23/2016. 7:44 PM.  History   Chief Complaint Chief Complaint  Patient presents with  . Abscess    mouth   The history is provided by the patient and medical records. No language interpreter was used.  Dental Pain   This is a recurrent problem. The current episode started 12 to 24 hours ago. The problem occurs constantly. The problem has not changed since onset.The pain is at a severity of 8/10. The pain is moderate. Treatments tried: NSAIDs. The treatment provided mild relief.   HPI Comments: Michael Clayton is a 26 y.o. male who presents to the Emergency Department complaining of R lower dental pain and abscess that began today. He reports he's had issues with this tooth, last visit to the ED was 11/26/16 and he was given PCN VK; states that he improved and his symptoms resolved, however this morning he awoke with recurrent symptoms. States he has a dentist appt in 2 days and just came for a "refill of the antibiotic" to get him to that appt. He describes his pain as 8/10 constant aching/throbbing nonradiating R lower tooth pain, worse with chewing on that side, and mildly improved with ibuprofen use. He also reports an associated dental abscess in that area. He denies any gum drainage, trismus, drooling, ear pain, ear drainage, difficulty swallowing, fever, chills, CP, SOB, abd pain, n/v/d/c, dysuria, hematuria, numbness/tingling, focal weakness, or any other complaints at this time. Nonsmoker.     PCP: Julieanne Manson, MD  Past Medical History:  Diagnosis Date  . Asthma     Patient Active Problem List   Diagnosis Date Noted  . Chalazion of right upper eyelid  05/19/2014  . Periorbital cellulitis of right eye 05/18/2014  . Acute sinusitis 05/18/2014  . Asthma 05/18/2014    Past Surgical History:  Procedure Laterality Date  . EYE SURGERY Left        Home Medications    Prior to Admission medications   Medication Sig Start Date End Date Taking? Authorizing Provider  albuterol (PROVENTIL HFA;VENTOLIN HFA) 108 (90 BASE) MCG/ACT inhaler Inhale 2 puffs into the lungs daily as needed for wheezing or shortness of breath. For shortness of breath    Historical Provider, MD  albuterol (PROVENTIL) (2.5 MG/3ML) 0.083% nebulizer solution Take 3 mLs (2.5 mg total) by nebulization every 4 (four) hours as needed for wheezing or shortness of breath. Patient not taking: Reported on 11/26/2016 06/08/16   Belton Peplinski, PA-C  ibuprofen (ADVIL,MOTRIN) 800 MG tablet Take 1 tablet (800 mg total) by mouth every 8 (eight) hours as needed for mild pain or moderate pain. 11/26/16   Trixie Dredge, PA-C  ipratropium (ATROVENT) 0.02 % nebulizer solution Take 2.5 mLs (0.5 mg total) by nebulization every 6 (six) hours as needed for wheezing or shortness of breath. Patient not taking: Reported on 11/26/2016 06/08/16   [facility] Bolden Hagerman, PA-C  penicillin v potassium (VEETID) 500 MG tablet Take 1 tablet (500 mg total) by mouth 4 (four) times daily. 11/26/16   Trixie Dredge, PA-C  penicillin v potassium (VEETID) 500 MG tablet Take 2 tablets (1,000 mg total) by mouth 2 (two) times daily. X 7 days 12/23/16   [address] PA-C  pseudoephedrine (SUDAFED) 60 MG tablet  Take 120 mg by mouth every 4 (four) hours as needed for congestion.    Historical Provider, MD    Family History Family History  Problem Relation Age of Onset  . Diabetes Mellitus II Neg Hx     Social History Social History  Substance Use Topics  . Smoking status: Former Smoker    Packs/day: 0.20    Years: 1.00    Types: Cigarettes    Start date: 05/23/2007    Quit date: 05/22/2008  . Smokeless tobacco: Never Used  .  Alcohol use 1.5 oz/week    3 Standard drinks or equivalent per week     Comment: social     Allergies   Patient has no known allergies.   Review of Systems Review of Systems  Constitutional: Negative for chills and fever.  HENT: Positive for dental problem. Negative for drooling, ear discharge, ear pain, sore throat and trouble swallowing.   Respiratory: Negative for shortness of breath.   Cardiovascular: Negative for chest pain.  Gastrointestinal: Negative for abdominal pain, constipation, diarrhea, nausea and vomiting.  Genitourinary: Negative for dysuria and hematuria.  Musculoskeletal: Negative for arthralgias and myalgias.  Skin: Negative for color change.  Allergic/Immunologic: Negative for immunocompromised state.  Neurological: Negative for weakness and numbness.  Psychiatric/Behavioral: Negative for confusion.   10 Systems reviewed and all are negative for acute change except as noted in the HPI.  Physical Exam Updated Vital Signs BP 129/72 (BP Location: Right Arm)   Pulse 80   Temp 97.9 F (36.6 C) (Oral)   Resp 18   SpO2 100%   Physical Exam  Constitutional: He is oriented to person, place, and time. Vital signs are normal. He appears well-developed and well-nourished.  Non-toxic appearance. No distress.  Afebrile, nontoxic, NAD  HENT:  Head: Normocephalic and atraumatic.  Nose: Nose normal.  Mouth/Throat: Uvula is midline, oropharynx is clear and moist and mucous membranes are normal. No trismus in the jaw. Dental abscesses and dental caries present. No uvula swelling. Tonsils are 0 on the right. Tonsils are 0 on the left. No tonsillar exudate.  Right lower tooth #29 decayed, with mild gingival erythema and swelling adjacent to the tooth, with indurated abscess but no fluctuance or drainage, no evidence of Ludwig's. Nose clear. Oropharynx clear and moist, without uvular swelling or deviation, no trismus or drooling, no tonsillar swelling or erythema, no exudates.    Eyes: Conjunctivae and EOM are normal. Right eye exhibits no discharge. Left eye exhibits no discharge.  Neck: Normal range of motion. Neck supple.  Cardiovascular: Normal rate and intact distal pulses.   Pulmonary/Chest: Effort normal. No respiratory distress.  Abdominal: Normal appearance. He exhibits no distension.  Musculoskeletal: Normal range of motion.  Neurological: He is alert and oriented to person, place, and time. He has normal strength. No sensory deficit.  Skin: Skin is warm, dry and intact. No rash noted.  Psychiatric: He has a normal mood and affect.  Nursing note and vitals reviewed.    ED Treatments / Results  DIAGNOSTIC STUDIES: Oxygen Saturation is 100% on RA, normal by my interpretation.    COORDINATION OF CARE: 7:48 PM- Pt advised of plan for treatment and pt agrees.  Labs (all labs ordered are listed, but only abnormal results are displayed) Labs Reviewed - No data to display  EKG  EKG Interpretation None       Radiology No results found.  Procedures Procedures (including critical care time)  Medications Ordered in ED Medications - No data  to display   Initial Impression / Assessment and Plan / ED Course  I have reviewed the triage vital signs and the nursing notes.  Pertinent labs & imaging results that were available during my care of the patient were reviewed by me and considered in my medical decision making (see chart for details).     26 y.o. male here with Dental pain associated with dental caries and indurated dental abscess with patient afebrile, non toxic appearing and swallowing secretions well, no evidence of ludwig's. Not amendable to I&D at this stage. Pt has appt with dentist, I stressed the importance of dental follow up for ultimate management of dental pain.  I have also discussed reasons to return immediately to the ER.  Patient expresses understanding and agrees with plan.  I will also give PCN VK and discussed OTC remedies  for pain control.    I personally performed the services described in this documentation, which was scribed in my presence. The recorded information has been reviewed and is accurate.   Final Clinical Impressions(s) / ED Diagnoses   Final diagnoses:  Dental abscess  Pain due to dental caries    New Prescriptions New Prescriptions   PENICILLIN V POTASSIUM (VEETID) 500 MG TABLET    Take 2 tablets (1,000 mg total) by mouth 2 (two) times daily. X 7 days       [address], PA-C 12/23/16 2010    Rolland Porter, MD 12/24/16 [phone]

## 2016-12-23 NOTE — ED Notes (Signed)
Pt ambulatory and independent at discharge.  Verbalized understanding of discharge instructions 

## 2016-12-25 ENCOUNTER — Encounter: Payer: Self-pay | Admitting: Internal Medicine

## 2016-12-25 ENCOUNTER — Ambulatory Visit (INDEPENDENT_AMBULATORY_CARE_PROVIDER_SITE_OTHER): Payer: Self-pay | Admitting: Internal Medicine

## 2016-12-25 VITALS — BP 118/82 | HR 74 | Resp 12 | Ht 70.5 in | Wt 182.0 lb

## 2016-12-25 DIAGNOSIS — Z659 Problem related to unspecified psychosocial circumstances: Secondary | ICD-10-CM

## 2016-12-25 DIAGNOSIS — K047 Periapical abscess without sinus: Secondary | ICD-10-CM

## 2016-12-25 DIAGNOSIS — J45909 Unspecified asthma, uncomplicated: Secondary | ICD-10-CM

## 2016-12-25 MED ORDER — HYDROCODONE-ACETAMINOPHEN 5-325 MG PO TABS: 1.0000 | ORAL_TABLET | Freq: Four times a day (QID) | ORAL | 0 refills | Status: DC | PRN

## 2016-12-25 NOTE — Progress Notes (Signed)
Subjective:    Patient ID: Michael Clayton, male    DOB: Oct 18, 1991, 26 y.o.   MRN: [id number]  HPI   Here to be established  1.  Dental abscess:    INitially seen 1 mont ago in ED a month previously 11/26/2016.  Pain and small amount of swelling then resolved.  Swelling and pain started up again about 1 week ago.  Went to ED again last night and diagnosed with recurrent abscess and new Rx for Pencillin given.  Told to take 1000 mg twice daily.   Has had 2 doses. Has been taking Ibuprofen 800 mg every 2 hours he feels.    Current Meds  Medication Sig  . albuterol (PROVENTIL HFA;VENTOLIN HFA) 108 (90 BASE) MCG/ACT inhaler Inhale 2 puffs into the lungs daily as needed for wheezing or shortness of breath. For shortness of breath  . ibuprofen (ADVIL,MOTRIN) 800 MG tablet Take 1 tablet (800 mg total) by mouth every 8 (eight) hours as needed for mild pain or moderate pain.  Marland Kitchen penicillin v potassium (VEETID) 500 MG tablet Take 2 tablets (1,000 mg total) by mouth 2 (two) times daily. X 7 days   No Known Allergies   Past Medical History:  Diagnosis Date  . Asthma as a child   only using rescue inhaler.  Triggers are pollen allergies, cats, dogs, smoke    Past Surgical History:  Procedure Laterality Date  . EYE SURGERY Bilateral 2014   Detached retina on left with prophylactic laser surgery on the right as apparently at risk.   Family History  Problem Relation Age of Onset  . Peptic Ulcer Mother     History of bleeding ulcer  . Sickle cell trait Brother     Social History   Social History  . Marital status: Single    Spouse name: N/A  . Number of children: 1  . Years of education: 109 + CC   Occupational History  . room service at [employer]    Social History Main Topics  . Smoking status: Former Smoker    Packs/day: 0.20    Years: 1.00    Types: Cigarettes    Start date: 05/23/2007    Quit date: 05/22/2008  . Smokeless tobacco: Never Used  . Alcohol use 1.5 oz/week    3 Standard drinks or equivalent per week     Comment: --once on weekend  . Drug use: No  . Sexual activity: Not on file   Other Topics Concern  . Not on file   Social History Narrative   Born and raised in [place] to [place].   Currently living with mother and trying to work hard to get custody of his infant daughter.    Review of Systems     Objective:   Physical Exam Holding right jaw HEENT:  PERRL, EOMI, TMs pearly gray, throat without injection.  Broken right lower premolar with swelling felt more easily with palpation from outside of mouth.  Tender.  No fluctuance on inside about gingiva Neck:  Supple, No adenopathy Chest:  CTA CV:  RRR with normal S1 and S2, No S3, S4 or murmur.  Radial pulses normal and equal       Assessment & Plan:  1.  Right Dental abscess:  Add a small amount of hydrocodone for pain for next couple of days. Needs orange card to complete dental referral.  2.  Loss of best friend and working on getting custody of 7 mo  daughter.  Will ask SW to get involved.  He may needs counseling as well as Legal Aid  3.  Asthma:  To return once has orange card and can get started on regular care.

## 2016-12-25 NOTE — Patient Instructions (Signed)
Can google "advance directives, [place]"  And bring up form from Secretary of State. Print and fill out Or can go to "5 wishes"  Which is also in Spanish and fill out--this costs $5--perhaps easier to use. Designate a Medical Power of Attorney to speak for you if you are unable to speak for yourself when ill or injured  

## 2016-12-28 ENCOUNTER — Encounter (HOSPITAL_COMMUNITY): Payer: Self-pay

## 2016-12-28 ENCOUNTER — Emergency Department (HOSPITAL_COMMUNITY)
Admission: EM | Admit: 2016-12-28 | Discharge: 2016-12-29 | Disposition: A | Discharge status: Home / Self Care | Payer: Self-pay | Attending: Emergency Medicine | Admitting: Emergency Medicine

## 2016-12-28 DIAGNOSIS — Z87891 Personal history of nicotine dependence: Secondary | ICD-10-CM | POA: Insufficient documentation

## 2016-12-28 DIAGNOSIS — Z79899 Other long term (current) drug therapy: Secondary | ICD-10-CM | POA: Insufficient documentation

## 2016-12-28 DIAGNOSIS — J45901 Unspecified asthma with (acute) exacerbation: Secondary | ICD-10-CM | POA: Insufficient documentation

## 2016-12-28 MED ORDER — ALBUTEROL SULFATE (2.5 MG/3ML) 0.083% IN NEBU
2.5000 mg | INHALATION_SOLUTION | RESPIRATORY_TRACT | Status: AC
  Administered 2016-12-28 (×3): 2.5 mg via RESPIRATORY_TRACT
  Filled 2016-12-28 (×2): qty 3

## 2016-12-28 MED ORDER — PREDNISONE 20 MG PO TABS
40.0000 mg | ORAL_TABLET | Freq: Once | ORAL | Status: AC
  Administered 2016-12-28: 40 mg via ORAL
  Filled 2016-12-28: qty 2

## 2016-12-28 MED ORDER — IPRATROPIUM-ALBUTEROL 0.5-2.5 (3) MG/3ML IN SOLN
3.0000 mL | RESPIRATORY_TRACT | Status: AC
  Administered 2016-12-28 (×3): 3 mL via RESPIRATORY_TRACT
  Filled 2016-12-28: qty 9
  Filled 2016-12-28: qty 3

## 2016-12-28 MED ORDER — ALBUTEROL SULFATE (2.5 MG/3ML) 0.083% IN NEBU
5.0000 mg | INHALATION_SOLUTION | Freq: Once | RESPIRATORY_TRACT | Status: AC
  Administered 2016-12-28: 5 mg via RESPIRATORY_TRACT
  Filled 2016-12-28: qty 6

## 2016-12-28 MED ORDER — ALBUTEROL SULFATE HFA 108 (90 BASE) MCG/ACT IN AERS: 2.0000 | INHALATION_SPRAY | Freq: Every day | RESPIRATORY_TRACT | 2 refills | Status: DC | PRN

## 2016-12-28 MED ORDER — ALBUTEROL SULFATE HFA 108 (90 BASE) MCG/ACT IN AERS
2.0000 | INHALATION_SPRAY | Freq: Once | RESPIRATORY_TRACT | Status: AC
Start: 2016-12-29 — End: 2016-12-29
  Administered 2016-12-29: 2 via RESPIRATORY_TRACT
  Filled 2016-12-28: qty 6.7

## 2016-12-28 NOTE — ED Provider Notes (Signed)
[facility]-EMERGENCY DEPT Provider Note   CSN: [id number] Arrival date & time: 12/28/16  2100     History   Chief Complaint Chief Complaint  Patient presents with  . Asthma    HPI Michael Clayton is a 26 y.o. male.  The history is provided by the patient.  Asthma  This is a new problem. The current episode started yesterday. The problem occurs constantly. The problem has been gradually worsening. Associated symptoms include shortness of breath. Pertinent negatives include no chest pain and no abdominal pain. Exacerbated by: being around cigarette smoke. Nothing relieves the symptoms. He has tried nothing for the symptoms. The treatment provided no relief.  Shortness of Breath  Associated symptoms include wheezing. Pertinent negatives include no fever, no sore throat, no ear pain, no cough, no chest pain, no vomiting, no abdominal pain and no rash. Associated medical issues include asthma.    Past Medical History:  Diagnosis Date  . Asthma as a child   only using rescue inhaler.  Triggers are pollen allergies, cats, dogs, smoke    Patient Active Problem List   Diagnosis Date Noted  . Chalazion of right upper eyelid 05/19/2014  . Periorbital cellulitis of right eye 05/18/2014  . Acute sinusitis 05/18/2014  . Asthma 05/18/2014    Past Surgical History:  Procedure Laterality Date  . EYE SURGERY Bilateral 2014   Detached retina on left with prophylactic laser surgery on the right as apparently at risk.       Home Medications    Prior to Admission medications   Medication Sig Start Date End Date Taking? Authorizing Provider  albuterol (PROVENTIL HFA;VENTOLIN HFA) 108 (90 Base) MCG/ACT inhaler Inhale 2 puffs into the lungs daily as needed for wheezing or shortness of breath. For shortness of breath 12/28/16   Lennette Bihari, MD  HYDROcodone-acetaminophen (NORCO/VICODIN) 5-325 MG tablet Take 1 tablet by mouth every 6 (six) hours as needed for moderate pain. 12/25/16    Julieanne Manson, MD  ibuprofen (ADVIL,MOTRIN) 800 MG tablet Take 1 tablet (800 mg total) by mouth every 8 (eight) hours as needed for mild pain or moderate pain. 11/26/16   Trixie Dredge, PA-C  penicillin v potassium (VEETID) 500 MG tablet Take 2 tablets (1,000 mg total) by mouth 2 (two) times daily. X 7 days 12/23/16   [address], PA-C  predniSONE (DELTASONE) 20 MG tablet Take 2 tablets (40 mg total) by mouth daily. 12/29/16 01/02/17  Lennette Bihari, MD    Family History Family History  Problem Relation Age of Onset  . Peptic Ulcer Mother     History of bleeding ulcer  . Sickle cell trait Brother     Social History Social History  Substance Use Topics  . Smoking status: Former Smoker    Packs/day: 0.20    Years: 1.00    Types: Cigarettes    Start date: 05/23/2007    Quit date: 05/22/2008  . Smokeless tobacco: Never Used  . Alcohol use 1.5 oz/week    3 Standard drinks or equivalent per week     Comment: --once on weekend     Allergies   Patient has no known allergies.   Review of Systems Review of Systems  Constitutional: Negative for chills and fever.  HENT: Negative for ear pain and sore throat.   Eyes: Negative for pain and visual disturbance.  Respiratory: Positive for shortness of breath and wheezing. Negative for cough.   Cardiovascular: Negative for chest pain and palpitations.  Gastrointestinal: Negative for  abdominal pain and vomiting.  Genitourinary: Negative for dysuria and hematuria.  Musculoskeletal: Negative for arthralgias and back pain.  Skin: Negative for color change and rash.  Neurological: Negative for seizures and syncope.  All other systems reviewed and are negative.    Physical Exam Updated Vital Signs BP 131/82   Pulse 90   Temp 98 F (36.7 C) (Oral)   Resp 18   Ht 6' (1.829 m)   Wt 82.6 kg   SpO2 98%   BMI 24.68 kg/m   Physical Exam  Constitutional: He appears well-developed and well-nourished.  HENT:  Head: Normocephalic  and atraumatic.  Eyes: Conjunctivae are normal.  Neck: Neck supple.  Cardiovascular: Normal rate and regular rhythm.   No murmur heard. Pulmonary/Chest: Effort normal. No respiratory distress. He has wheezes.  Pt talking in complete sentences. Normal work of breathing. He has diffuse expiratory wheezing, occasional inspiratory wheezing.  Abdominal: Soft. There is no tenderness.  Musculoskeletal: He exhibits no edema, tenderness or deformity.  Neurological: He is alert.  Skin: Skin is warm and dry.  Psychiatric: He has a normal mood and affect.  Nursing note and vitals reviewed.    ED Treatments / Results  Labs (all labs ordered are listed, but only abnormal results are displayed) Labs Reviewed - No data to display  EKG  EKG Interpretation None       Radiology No results found.  Procedures Procedures (including critical care time)  Medications Ordered in ED Medications  ipratropium-albuterol (DUONEB) 0.5-2.5 (3) MG/3ML nebulizer solution 3 mL (3 mLs Nebulization Given 12/28/16 2203)  predniSONE (DELTASONE) tablet 40 mg (40 mg Oral Given 12/28/16 2232)  albuterol (PROVENTIL) (2.5 MG/3ML) 0.083% nebulizer solution 2.5 mg (2.5 mg Nebulization Given 12/28/16 2304)  albuterol (PROVENTIL) (2.5 MG/3ML) 0.083% nebulizer solution 5 mg (5 mg Nebulization Given 12/28/16 2353)  albuterol (PROVENTIL HFA;VENTOLIN HFA) 108 (90 Base) MCG/ACT inhaler 2 puff (2 puffs Inhalation Given 12/29/16 0023)     Initial Impression / Assessment and Plan / ED Course  I have reviewed the triage vital signs and the nursing notes.  Pertinent labs & imaging results that were available during my care of the patient were reviewed by me and considered in my medical decision making (see chart for details).    Pt with hx as above who p/w resp distress. He reports this is an asthma exacerbation likely d/t being exposed to cigarette smoke last night. He is also out of his inhaler. Pt is working on establishing  with PCP. He is well-appearing here but does have diffuse wheezing. 3 back to back DuoNebs given. Pt has mild improvement but still feels tight. Prednisone PO and several add'l albuterol nebs given. Pt reports feeling much better. Able to ambulate w/o difficulty or desat. Albuterol MDI provided along with Rx for same. Plan is for pt to f/u with PCP and return here if his sx worsen. Will Rx 4 add'l days of Prednisone. Pt discharged in stable condition. Return precautions discussed in detail.  Final Clinical Impressions(s) / ED Diagnoses   Final diagnoses:  Exacerbation of asthma, unspecified asthma severity, unspecified whether persistent    New Prescriptions Discharge Medication List as of 12/29/2016 12:20 AM    START taking these medications   Details  predniSONE (DELTASONE) 20 MG tablet Take 2 tablets (40 mg total) by mouth daily., Starting Mon 12/29/2016, Until Fri 01/02/2017, Print         Lennette BihariJohn Michael Jessikah Dicker, MD 12/29/16 1030    Blane OharaJoshua Zavitz, MD 01/02/17  1209  

## 2016-12-28 NOTE — ED Triage Notes (Signed)
Onset today pt had to use inhaler and nebulizer frequently for shortness of breath.  No cough.  Pt feels he needs steroids now.

## 2016-12-29 MED ORDER — PREDNISONE 20 MG PO TABS: 40.0000 mg | ORAL_TABLET | Freq: Every day | ORAL | 0 refills | Status: AC

## 2016-12-29 NOTE — ED Notes (Signed)
Pt verbalized understanding of d/c instructions and has no further questions. Pt stable and NAD. VSS.  

## 2016-12-29 NOTE — ED Notes (Signed)
Pt ambulated to end of hallway and back without difficulty, spo2 remained above 96% the entire time and pt denies shob and no wheezing.

## 2017-01-01 ENCOUNTER — Other Ambulatory Visit: Payer: Self-pay | Admitting: Licensed Clinical Social Worker

## 2017-02-17 ENCOUNTER — Inpatient Hospital Stay (HOSPITAL_COMMUNITY)
Admission: EM | Admit: 2017-02-17 | Discharge: 2017-02-18 | DRG: 203 | Disposition: A | Discharge status: Home / Self Care | Payer: Self-pay | Attending: Internal Medicine | Admitting: Internal Medicine

## 2017-02-17 ENCOUNTER — Emergency Department (HOSPITAL_COMMUNITY): Payer: Self-pay

## 2017-02-17 ENCOUNTER — Encounter (HOSPITAL_COMMUNITY): Payer: Self-pay | Admitting: Emergency Medicine

## 2017-02-17 DIAGNOSIS — E876 Hypokalemia: Secondary | ICD-10-CM | POA: Clinically undetermined

## 2017-02-17 DIAGNOSIS — Z87891 Personal history of nicotine dependence: Secondary | ICD-10-CM

## 2017-02-17 DIAGNOSIS — J45901 Unspecified asthma with (acute) exacerbation: Principal | ICD-10-CM | POA: Diagnosis present

## 2017-02-17 DIAGNOSIS — J4551 Severe persistent asthma with (acute) exacerbation: Secondary | ICD-10-CM

## 2017-02-17 DIAGNOSIS — R0603 Acute respiratory distress: Secondary | ICD-10-CM | POA: Diagnosis present

## 2017-02-17 LAB — BASIC METABOLIC PANEL
Anion gap: 10 (ref 5–15)
Anion gap: 12 (ref 5–15)
BUN: 15 mg/dL (ref 6–20)
BUN: 14 mg/dL (ref 6–20)
CHLORIDE: 106 mmol/L (ref 101–111)
CO2: 26 mmol/L (ref 22–32)
CO2: 23 mmol/L (ref 22–32)
CREATININE: 1.08 mg/dL (ref 0.61–1.24)
Calcium: 9.2 mg/dL (ref 8.9–10.3)
Calcium: 8.9 mg/dL (ref 8.9–10.3)
Chloride: 106 mmol/L (ref 101–111)
Creatinine, Ser: 1.09 mg/dL (ref 0.61–1.24)
GFR calc Af Amer: >60 mL/min (ref >60)
GFR calc non Af Amer: >60 mL/min (ref >60)
GFR calc non Af Amer: >60 mL/min (ref >60)
GLUCOSE: 100 mg/dL — AB (ref 65–99)
GLUCOSE: 119 mg/dL — AB (ref 65–99)
Potassium: 3.8 mmol/L (ref 3.5–5.1)
Potassium: 3.5 mmol/L (ref 3.5–5.1)
Sodium: 142 mmol/L (ref 135–145)
Sodium: 141 mmol/L (ref 135–145)

## 2017-02-17 LAB — I-STAT ARTERIAL BLOOD GAS, ED
Acid-Base Excess: 2 mmol/L (ref 0.0–2.0)
BICARBONATE: 29.7 mmol/L — AB (ref 20.0–28.0)
O2 Saturation: 99 %
PCO2 ART: 57.4 mmHg — AB (ref 32.0–48.0)
PO2 ART: 165 mmHg — AB (ref 83.0–108.0)
Patient temperature: 98.1
TCO2: 31 mmol/L (ref 0–100)
pH, Arterial: 7.32 — ABNORMAL LOW (ref 7.350–7.450)

## 2017-02-17 LAB — CBC WITH DIFFERENTIAL/PLATELET
Basophils Absolute: 0 10*3/uL (ref 0.0–0.1)
Basophils Relative: 0 %
Eosinophils Absolute: 0.3 10*3/uL (ref 0.0–0.7)
Eosinophils Relative: 6 %
HEMATOCRIT: 43.7 % (ref 39.0–52.0)
HEMOGLOBIN: 14.8 g/dL (ref 13.0–17.0)
Lymphocytes Relative: 41 %
Lymphs Abs: 2 10*3/uL (ref 0.7–4.0)
MCH: 29.4 pg (ref 26.0–34.0)
MCHC: 33.9 g/dL (ref 30.0–36.0)
MCV: 86.7 fL (ref 78.0–100.0)
MONO ABS: 0.3 10*3/uL (ref 0.1–1.0)
Monocytes Relative: 6 %
NEUTROS PCT: 47 %
Neutro Abs: 2.3 10*3/uL (ref 1.7–7.7)
Platelets: 205 10*3/uL (ref 150–400)
RBC: 5.04 MIL/uL (ref 4.22–5.81)
RDW: 13.1 % (ref 11.5–15.5)
WBC: 4.9 10*3/uL (ref 4.0–10.5)

## 2017-02-17 LAB — HIV ANTIBODY (ROUTINE TESTING W REFLEX): HIV SCREEN 4TH GENERATION: NONREACTIVE

## 2017-02-17 MED ORDER — ALBUTEROL (5 MG/ML) CONTINUOUS INHALATION SOLN
15.0000 mg/h | INHALATION_SOLUTION | Freq: Once | RESPIRATORY_TRACT | Status: AC
  Administered 2017-02-17: 15 mg/h via RESPIRATORY_TRACT

## 2017-02-17 MED ORDER — IPRATROPIUM-ALBUTEROL 0.5-2.5 (3) MG/3ML IN SOLN
3.0000 mL | Freq: Once | RESPIRATORY_TRACT | Status: AC
  Administered 2017-02-17: 3 mL via RESPIRATORY_TRACT
  Filled 2017-02-17: qty 3

## 2017-02-17 MED ORDER — SODIUM CHLORIDE 0.9% FLUSH
3.0000 mL | Freq: Two times a day (BID) | INTRAVENOUS | Status: DC
  Administered 2017-02-17 – 2017-02-18 (×2): 3 mL via INTRAVENOUS

## 2017-02-17 MED ORDER — PREDNISONE 20 MG PO TABS
40.0000 mg | ORAL_TABLET | Freq: Every day | ORAL | Status: DC
  Administered 2017-02-18: 40 mg via ORAL
  Filled 2017-02-17: qty 2

## 2017-02-17 MED ORDER — ONDANSETRON HCL 4 MG/2ML IJ SOLN
4.0000 mg | Freq: Four times a day (QID) | INTRAMUSCULAR | Status: DC | PRN
Start: 2017-02-17 — End: 2017-02-18

## 2017-02-17 MED ORDER — ALBUTEROL (5 MG/ML) CONTINUOUS INHALATION SOLN
INHALATION_SOLUTION | RESPIRATORY_TRACT | Status: AC
  Administered 2017-02-17: 15 mg/h via RESPIRATORY_TRACT
  Filled 2017-02-17: qty 20

## 2017-02-17 MED ORDER — IPRATROPIUM-ALBUTEROL 0.5-2.5 (3) MG/3ML IN SOLN
3.0000 mL | Freq: Four times a day (QID) | RESPIRATORY_TRACT | Status: DC
  Administered 2017-02-17 – 2017-02-18 (×7): 3 mL via RESPIRATORY_TRACT
  Filled 2017-02-17 (×7): qty 3

## 2017-02-17 MED ORDER — METHYLPREDNISOLONE SODIUM SUCC 125 MG IJ SOLR
125.0000 mg | Freq: Once | INTRAMUSCULAR | Status: AC
  Administered 2017-02-17: 125 mg via INTRAVENOUS

## 2017-02-17 MED ORDER — SODIUM CHLORIDE 0.9 % IV SOLN: 250.0000 mL | INTRAVENOUS | Status: DC | PRN

## 2017-02-17 MED ORDER — POLYETHYLENE GLYCOL 3350 17 G PO PACK: 17.0000 g | PACK | Freq: Every day | ORAL | Status: DC | PRN

## 2017-02-17 MED ORDER — ALBUTEROL SULFATE (2.5 MG/3ML) 0.083% IN NEBU
INHALATION_SOLUTION | RESPIRATORY_TRACT | Status: AC
  Filled 2017-02-17: qty 6

## 2017-02-17 MED ORDER — IPRATROPIUM BROMIDE 0.02 % IN SOLN
RESPIRATORY_TRACT | Status: AC
  Filled 2017-02-17: qty 2.5

## 2017-02-17 MED ORDER — ENOXAPARIN SODIUM 40 MG/0.4ML ~~LOC~~ SOLN
40.0000 mg | SUBCUTANEOUS | Status: DC
  Administered 2017-02-17 – 2017-02-18 (×2): 40 mg via SUBCUTANEOUS
  Filled 2017-02-17 (×2): qty 0.4

## 2017-02-17 MED ORDER — ACETAMINOPHEN 325 MG PO TABS: 650.0000 mg | ORAL_TABLET | Freq: Four times a day (QID) | ORAL | Status: DC | PRN

## 2017-02-17 MED ORDER — IPRATROPIUM-ALBUTEROL 0.5-2.5 (3) MG/3ML IN SOLN
RESPIRATORY_TRACT | Status: AC
  Filled 2017-02-17: qty 3

## 2017-02-17 MED ORDER — MAGNESIUM SULFATE 2 GM/50ML IV SOLN
2.0000 g | Freq: Once | INTRAVENOUS | Status: AC
  Administered 2017-02-17: 2 g via INTRAVENOUS

## 2017-02-17 MED ORDER — ACETAMINOPHEN 650 MG RE SUPP: 650.0000 mg | Freq: Four times a day (QID) | RECTAL | Status: DC | PRN

## 2017-02-17 MED ORDER — SODIUM CHLORIDE 0.9% FLUSH: 3.0000 mL | INTRAVENOUS | Status: DC | PRN

## 2017-02-17 MED ORDER — ALBUTEROL SULFATE (2.5 MG/3ML) 0.083% IN NEBU: 2.5000 mg | INHALATION_SOLUTION | RESPIRATORY_TRACT | Status: DC | PRN

## 2017-02-17 MED ORDER — SODIUM CHLORIDE 0.9% FLUSH
3.0000 mL | Freq: Two times a day (BID) | INTRAVENOUS | Status: DC
  Administered 2017-02-17: 3 mL via INTRAVENOUS

## 2017-02-17 MED ORDER — ONDANSETRON HCL 4 MG PO TABS: 4.0000 mg | ORAL_TABLET | Freq: Four times a day (QID) | ORAL | Status: DC | PRN

## 2017-02-17 MED ORDER — IPRATROPIUM BROMIDE 0.02 % IN SOLN
1.0000 mg | Freq: Once | RESPIRATORY_TRACT | Status: AC
  Administered 2017-02-17: 1 mg via RESPIRATORY_TRACT

## 2017-02-17 MED ORDER — METHYLPREDNISOLONE SODIUM SUCC 125 MG IJ SOLR
INTRAMUSCULAR | Status: AC
  Filled 2017-02-17: qty 2

## 2017-02-17 MED ORDER — ALBUTEROL SULFATE (2.5 MG/3ML) 0.083% IN NEBU
5.0000 mg | INHALATION_SOLUTION | Freq: Once | RESPIRATORY_TRACT | Status: AC
  Administered 2017-02-17: 5 mg via RESPIRATORY_TRACT

## 2017-02-17 MED ORDER — METHYLPREDNISOLONE SODIUM SUCC 125 MG IJ SOLR
60.0000 mg | Freq: Four times a day (QID) | INTRAMUSCULAR | Status: DC
  Administered 2017-02-17: 60 mg via INTRAVENOUS
  Filled 2017-02-17: qty 2

## 2017-02-17 NOTE — Progress Notes (Signed)
SEEN EXAMINED   Patient feels better.  Some sob.  No cough but some mild wheeze.  Asking about steroids.  Tells me was at mothers house where there is mould.    Will give po steroids-will reassess in am Could go home as early as then Might need work-note if going in am 02/18/17  Pleas Koch, MD [facility] [phone]

## 2017-02-17 NOTE — H&P (Signed)
History and Physical    Michael Clayton UEA:[id number] DOB: [birth year] DOA: 02/17/2017  PCP: No PCP Per Patient   Patient coming from: Home  Chief Complaint: SOB, wheezing   HPI: Michael Clayton is a 26 y.o. male with medical history significant for asthma now presenting to the emergency department with acute dyspnea and wheezing. Patient reports that he had been in his usual state of health until he began to notice some increased wheezing and chest tightness yesterday. Experience mild relief with his inhaler. Symptoms worsened acutely overnight and he was unable to find any relief with multiple albuterol treatments at home. With his condition continued to worsen, he came into the ED for evaluation. He denies any recent fevers or chills and denies any rhinorrhea or sore throat. There has not been any chest pain or palpitations and no lower extremity tenderness or swelling. He denies any prolonged immobilization, sick contacts, or personal or family history of VTE. He reports being intubated at the age of 66 for asthma exacerbation, but reports that the current episode is the worst that he can remember. He believes it started after he stayed over at someone else's house where there was a musty odor from the carpet and he believes there was an allergy in that triggered his asthma. He reports not seeing a physician regularly due to lack of insurance and has been treated with albuterol only. He uses his inhaler just about every single day.   ED Course: Upon arrival to the ED, patient is found to be afebrile, saturating adequately on room air tachypneic to 30, in acute respiratory distress, but with normal heart rate and blood pressure. EKG features a sinus rhythm with significant artifact. Chest x-ray is negative for acute cardiopulmonary disease. Chemistry panel is unremarkable and CBC is entirely within normal limits. Patient was treated with 2 g of magnesium, 125 mg IV Solu-Medrol, duo nebs, continuous  albuterol treatments, and placed on BiPAP. Patient improved with these measures and was able to be weaned from the BiPAP in the ED. He remains heme dynamically stable and his work of breathing has significantly improved. He will be admitted to the telemetry unit for ongoing evaluation and management of asthma with acute exacerbation.  Review of Systems:  All other systems reviewed and apart from HPI, are negative.  Past Medical History:  Diagnosis Date  . Asthma as a child   only using rescue inhaler.  Triggers are pollen allergies, cats, dogs, smoke    Past Surgical History:  Procedure Laterality Date  . EYE SURGERY Bilateral 2014   Detached retina on left with prophylactic laser surgery on the right as apparently at risk.     reports that he quit smoking about 8 years ago. His smoking use included Cigarettes. He started smoking about 9 years ago. He has a 0.20 pack-year smoking history. He has never used smokeless tobacco. He reports that he drinks about 1.5 oz of alcohol per week . He reports that he does not use drugs.  No Known Allergies  Family History  Problem Relation Age of Onset  . Peptic Ulcer Mother     History of bleeding ulcer  . Sickle cell trait Brother      Prior to Admission medications   Medication Sig Start Date End Date Taking? Authorizing Provider  albuterol (PROVENTIL HFA;VENTOLIN HFA) 108 (90 Base) MCG/ACT inhaler Inhale 2 puffs into the lungs daily as needed for wheezing or shortness of breath. For shortness of breath 12/28/16  Yes  Lennette Bihari, MD  HYDROcodone-acetaminophen (NORCO/VICODIN) 5-325 MG tablet Take 1 tablet by mouth every 6 (six) hours as needed for moderate pain. 12/25/16  Yes Julieanne Manson, MD  ibuprofen (ADVIL,MOTRIN) 800 MG tablet Take 1 tablet (800 mg total) by mouth every 8 (eight) hours as needed for mild pain or moderate pain. 11/26/16  Yes Trixie Dredge, PA-C    Physical Exam: Vitals:   02/17/17 0130 02/17/17 0139 02/17/17  0215 02/17/17 0245  BP: 132/83 124/60 137/71 131/64  Pulse: 97 95 82 77  Resp: Temp:      TempSrc:      SpO2: 96% 95% 99% 100%  Weight:      Height:          Constitutional: Tachypneic, dyspeic with speech, no pallor, no diaphoresis.  Eyes: PERTLA, lids and conjunctivae normal ENMT: Mucous membranes are moist. Posterior pharynx clear of any exudate or lesions.   Neck: normal, supple, no masses, no thyromegaly Respiratory: Wheezing throughout. Increased WOB. No pallor or cyanosis.  Cardiovascular: S1 & S2 heard, regular rate and rhythm. No extremity edema. No significant JVD. Abdomen: No distension, no tenderness, no masses palpated. Bowel sounds normal.  Musculoskeletal: no clubbing / cyanosis. No joint deformity upper and lower extremities.   Skin: no significant rashes, lesions, ulcers. Warm, dry, well-perfused. Neurologic: CN 2-12 grossly intact. Sensation intact, DTR normal. Strength 5/5 in all 4 limbs.  Psychiatric: Alert and oriented x 3. Normal mood and affect.     Labs on Admission: I have personally reviewed following labs and imaging studies  CBC:  Recent Labs Lab 02/17/17 0056  WBC 4.9  NEUTROABS 2.3  HGB 14.8  HCT 43.7  MCV 86.7  PLT 205   Basic Metabolic Panel:  Recent Labs Lab 02/17/17 0056  NA 142  K 3.8  CL 106  CO2 26  GLUCOSE 100*  BUN 15  CREATININE 1.08  CALCIUM 9.2   GFR: Estimated Creatinine Clearance: 111.8 mL/min (by C-G formula based on SCr of 1.08 mg/dL). Liver Function Tests: No results for input(s): AST, ALT, ALKPHOS, BILITOT, PROT, ALBUMIN in the last 168 hours. No results for input(s): LIPASE, AMYLASE in the last 168 hours. No results for input(s): AMMONIA in the last 168 hours. Coagulation Profile: No results for input(s): INR, PROTIME in the last 168 hours. Cardiac Enzymes: No results for input(s): CKTOTAL, CKMB, CKMBINDEX, TROPONINI in the last 168 hours. BNP (last 3 results) No results for input(s):  PROBNP in the last 8760 hours. HbA1C: No results for input(s): HGBA1C in the last 72 hours. CBG: No results for input(s): GLUCAP in the last 168 hours. Lipid Profile: No results for input(s): CHOL, HDL, LDLCALC, TRIG, CHOLHDL, LDLDIRECT in the last 72 hours. Thyroid Function Tests: No results for input(s): TSH, T4TOTAL, FREET4, T3FREE, THYROIDAB in the last 72 hours. Anemia Panel: No results for input(s): VITAMINB12, FOLATE, FERRITIN, TIBC, IRON, RETICCTPCT in the last 72 hours. Urine analysis: No results found for: COLORURINE, APPEARANCEUR, LABSPEC, PHURINE, GLUCOSEU, HGBUR, BILIRUBINUR, KETONESUR, PROTEINUR, UROBILINOGEN, NITRITE, LEUKOCYTESUR Sepsis Labs: (procalcitonin:4,lacticidven:4) )No results found for this or any previous visit (from the past 240 hour(s)).   Radiological Exams on Admission: Dg Chest Portable 1 View  Result Date: 02/17/2017 CLINICAL DATA:  Shortness of breath and wheezing. History of asthma. EXAM: PORTABLE CHEST 1 VIEW COMPARISON:  04/18/2014 FINDINGS: Mild hyperinflation compatible with history of asthma. Normal heart size and pulmonary vascularity. No focal airspace disease or consolidation in the lungs. No blunting of  costophrenic angles. No pneumothorax. Mediastinal contours appear intact. IMPRESSION: No active disease. Electronically Signed   By: Burman Nieves M.D.   On: 02/17/2017 01:31    EKG: Independently reviewed. Sinus rhythm, significant artifact.   Assessment/Plan  1. Asthma with acute exacerbation  - Pt presents in acute respiratory distress, reporting a day or so of mild sxs with severe worsening the night of admit  - Required intubation as a child, using albuterol every day, no PCP follow-up or controller therapy  - He reports triggers to be allergens and smoke, and notes the current episode started after staying at a friend's house where there was a strong musty odor from the carpet - He was in acute distress on arrival, treated  with 2 g IV magnesium, 125 mg IV Solu-Medrol, DuoNeb, continuous albuterol neb, and started on BiPAP  - He has made good improvement in ED and is off BiPAP but continues to exhibit diffuse wheezes and mild distress while at rest - Continue systemic steroid and albuterol nebs  - Case management consulted for help with PCP and discharge medications    DVT prophylaxis: sq Lovenox Code Status: Full  Family Communication: Discussed with patient Disposition Plan: Admit to telemetry Consults called: None Admission status: Inpatient    Briscoe Deutscher, MD [facility] Pager [phone]  If 7PM-7AM, please contact night-coverage [email or web address] Password [email or web address]  02/17/2017, 3:05 AM

## 2017-02-17 NOTE — Progress Notes (Signed)
Received report from ED RN. Room ready for patient. Coran Dipaola Joselita, RN 

## 2017-02-17 NOTE — ED Notes (Signed)
Called patient's mother at his request, updated her on his condition. Will call back with updates.  Michaelle Copas [phone]

## 2017-02-17 NOTE — ED Notes (Signed)
Updated mother Kathie Rhodes on patient's plan of care.

## 2017-02-17 NOTE — ED Notes (Signed)
Pt off of bipap at this time, on RA, tolerating well. Resting comfortably.

## 2017-02-17 NOTE — ED Provider Notes (Signed)
TIME SEEN: 12:58 AM   02/17/2017  CHIEF COMPLAINT: Dyspnea  HPI: This is a 26 year old male with history of asthma who presents to the ED with respiratory distress. This patient has experienced worsening shortness of breath with chest tightness over the last 5 hours in the setting of increased nebulizer treatments at home. He denies any fevers or chills. No cough. States he has been exposed to recent tobacco smoke which she feels triggered his symptoms. He did require intubation for asthma exacerbation as a child. He is speaking in short sentences in triage, however not hypoxic.   ROS: Level V caveat for patient's condition  PAST MEDICAL HISTORY/PAST SURGICAL HISTORY:  Past Medical History:  Diagnosis Date  . Asthma as a child   only using rescue inhaler.  Triggers are pollen allergies, cats, dogs, smoke    MEDICATIONS:  Prior to Admission medications   Medication Sig Start Date End Date Taking? Authorizing Provider  albuterol (PROVENTIL HFA;VENTOLIN HFA) 108 (90 Base) MCG/ACT inhaler Inhale 2 puffs into the lungs daily as needed for wheezing or shortness of breath. For shortness of breath 12/28/16   Lennette Bihari, MD  HYDROcodone-acetaminophen (NORCO/VICODIN) 5-325 MG tablet Take 1 tablet by mouth every 6 (six) hours as needed for moderate pain. 12/25/16   Julieanne Manson, MD  ibuprofen (ADVIL,MOTRIN) 800 MG tablet Take 1 tablet (800 mg total) by mouth every 8 (eight) hours as needed for mild pain or moderate pain. 11/26/16   Trixie Dredge, PA-C  penicillin v potassium (VEETID) 500 MG tablet Take 2 tablets (1,000 mg total) by mouth 2 (two) times daily. X 7 days 12/23/16   Rhona Raider, PA-C    ALLERGIES:  No Known Allergies  SOCIAL HISTORY:  Social History  Substance Use Topics  . Smoking status: Former Smoker    Packs/day: 0.20    Years: 1.00    Types: Cigarettes    Start date: 05/23/2007    Quit date: 05/22/2008  . Smokeless tobacco: Never Used  . Alcohol use 1.5 oz/week     3 Standard drinks or equivalent per week     Comment: --once on weekend    FAMILY HISTORY: Family History  Problem Relation Age of Onset  . Peptic Ulcer Mother     History of bleeding ulcer  . Sickle cell trait Brother     EXAM: BP 136/73 (BP Location: Left Arm)   Pulse 91   Temp 98.1 F (36.7 C) (Oral)   Resp (!) 28   Ht 6' (1.829 m)   Wt 180 lb (81.6 kg)   SpO2 95%   BMI 24.41 kg/m  CONSTITUTIONAL: Alert and oriented and responds appropriately to questions. In moderate respiratory distress. Afebrile. HEAD: Normocephalic EYES: Conjunctivae clear, pupils appear equal, EOMI ENT: normal nose; moist mucous membranes NECK: Supple, no meningismus, no nuchal rigidity, no LAD  CARD: RRR; S1 and S2 appreciated; no murmurs, no clicks, no rubs, no gallops RESP: Moderate respiratory distress, speaking in short sentences, no hypoxia, diffuse expiratory wheezing, significantly diminished aeration, no rhonchi or rales; increased work of breathing, tachypneic. ABD/GI: Normal bowel sounds; non-distended; soft, non-tender, no rebound, no guarding, no peritoneal signs, no hepatosplenomegaly BACK:  The back appears normal and is non-tender to palpation, there is no CVA tenderness EXT: Normal ROM in all joints; non-tender to palpation; no edema; normal capillary refill; no cyanosis, no calf tenderness or swelling    SKIN: Normal color for age and race; warm; no rash NEURO: Moves all extremities equally  PSYCH: The patient's mood and manner are appropriate. Grooming and personal hygiene are appropriate.  MEDICAL DECISION MAKING: Patient here with moderate respiratory distress, wheezing, diminished aeration. Suspect asthma exacerbation. No infectious symptoms. Doubt ACS, dissection, PE. He reports he feels like his chest is "caving in". We'll obtain labs, ABG, chest x-ray. We'll give continuous albuterol, Atrovent, Solu-Medrol, magnesium.  ED PROGRESS:   2:40 AM  Pt's Labs unremarkable. Chest x-ray  clear. ABGs reassuring but patient put on BiPAP for increased work of breathing. This appears to have improved but he is still wheezing after an hour-long continuous neb. Continue duo nebs. Will attempt to wean off BiPAP. I feel he will need admission. Patient comfortable with this plan. He does not have a primary care provider.  2:53 AM Discussed patient's case with Hospitalist, Dr. Antionette Char.  I have recommended admission and patient (and family if present) agree with this plan. Admitting physician will place admission orders.   I reviewed all nursing notes, vitals, pertinent previous records, EKGs, lab and urine results, imaging (as available).    I personally performed the services described in this documentation, which was scribed in my presence. The recorded information has been reviewed and is accurate.    EKG Interpretation  Date/Time:  Tuesday February 17 2017 01:01:11 EDT Ventricular Rate:  83 PR Interval:    QRS Duration: 176 QT Interval:  396 QTC Calculation: 466 R Axis:   93 Text Interpretation:  Age not entered, assumed to be  26 years old for purpose of ECG interpretation Sinus rhythm Baseline wander in lead(s) V5 V6 No significant change since last tracing Confirmed by Hartwell Vandiver,  DO, Hanah Moultry [phone]) on 02/17/2017 1:07:59 AM       EKG Interpretation  Date/Time:  Tuesday February 17 2017 01:06:09 EDT Ventricular Rate:  84 PR Interval:    QRS Duration: 90 QT Interval:  378 QTC Calculation: 447 R Axis:   92 Text Interpretation:  Sinus arrhythmia Probable left atrial enlargement Baseline wander in lead(s) V5 V6 Confirmed by Mariaha Ellington,  DO, Haru Anspaugh ([id number]) on 02/17/2017 1:42:56 AM       CRITICAL CARE Performed by: Raelyn Number   Total critical care time: 45 minutes  Critical care time was exclusive of separately billable procedures and treating other patients.  Critical care was necessary to treat or prevent imminent or life-threatening deterioration.  Critical care was time  spent personally by me on the following activities: development of treatment plan with patient and/or surrogate as well as nursing, discussions with consultants, evaluation of patient's response to treatment, examination of patient, obtaining history from patient or surrogate, ordering and performing treatments and interventions, ordering and review of laboratory studies, ordering and review of radiographic studies, pulse oximetry and re-evaluation of patient's condition.   I personally performed the services described in this documentation, which was scribed in my presence. The recorded information has been reviewed and is accurate.    Layla Maw Nilo Fallin, DO 02/17/17 [phone]

## 2017-02-17 NOTE — ED Notes (Signed)
Pt resting comfortably on bipap.

## 2017-02-17 NOTE — Progress Notes (Signed)
New Admission Note:  Arrival Method: On stretcher from ED  Mental Orientation: Alert & Oriented x4 Telemetry: CCMD verified Assessment: Completed Skin: Refer to flowhseet IV: Right FA Pain: 0/10 Tubes: None Safety Measures: Safety Fall Prevention Plan discussed with patient. Admission: Completed [address] Orientation: Patient has been orientated to the room, unit and the staff.  Orders have been reviewed and implemented. Will continue to monitor the patient. Call light has been placed within reach. Aram Candela, RN  Phone Number: [phone]

## 2017-02-17 NOTE — ED Triage Notes (Signed)
Dr. Mora Bellman assessing patient.

## 2017-02-17 NOTE — ED Notes (Signed)
Placed on BiPAP at this time.

## 2017-02-17 NOTE — ED Notes (Signed)
Admitting MD at bedside.

## 2017-02-17 NOTE — ED Triage Notes (Signed)
Pt to ED from home c/o asthma attack - states this episode started about 8pm this evening and he's tried 4 nebulizer treatments at home without relief. Pt unable to talk in full sentences, states he is scared. History of intubation at age 26. Resp labored, pt using accessory muscles, reports tightness in chest and back.

## 2017-02-18 DIAGNOSIS — E876 Hypokalemia: Secondary | ICD-10-CM | POA: Clinically undetermined

## 2017-02-18 DIAGNOSIS — J4541 Moderate persistent asthma with (acute) exacerbation: Secondary | ICD-10-CM

## 2017-02-18 LAB — BASIC METABOLIC PANEL
ANION GAP: 11 (ref 5–15)
BUN: 10 mg/dL (ref 6–20)
CHLORIDE: 105 mmol/L (ref 101–111)
CO2: 24 mmol/L (ref 22–32)
Calcium: 9.1 mg/dL (ref 8.9–10.3)
Creatinine, Ser: 1.07 mg/dL (ref 0.61–1.24)
GFR calc Af Amer: >60 mL/min (ref >60)
GFR calc non Af Amer: >60 mL/min (ref >60)
Glucose, Bld: 113 mg/dL — ABNORMAL HIGH (ref 65–99)
POTASSIUM: 3.1 mmol/L — AB (ref 3.5–5.1)
Sodium: 140 mmol/L (ref 135–145)

## 2017-02-18 LAB — GLUCOSE, CAPILLARY: GLUCOSE-CAPILLARY: 96 mg/dL (ref 65–99)

## 2017-02-18 MED ORDER — ALBUTEROL SULFATE HFA 108 (90 BASE) MCG/ACT IN AERS: 2.0000 | INHALATION_SPRAY | Freq: Four times a day (QID) | RESPIRATORY_TRACT | 2 refills | Status: DC | PRN

## 2017-02-18 MED ORDER — SALINE SPRAY 0.65 % NA SOLN
1.0000 | NASAL | Status: DC | PRN
  Filled 2017-02-18: qty 44

## 2017-02-18 MED ORDER — ACETAMINOPHEN 325 MG PO TABS: 650.0000 mg | ORAL_TABLET | ORAL | Status: DC | PRN

## 2017-02-18 MED ORDER — GUAIFENESIN-DM 100-10 MG/5ML PO SYRP: 5.0000 mL | ORAL_SOLUTION | ORAL | Status: DC | PRN

## 2017-02-18 MED ORDER — GUAIFENESIN-DM 100-10 MG/5ML PO SYRP: 5.0000 mL | ORAL_SOLUTION | ORAL | 0 refills | Status: DC | PRN

## 2017-02-18 MED ORDER — LORATADINE 10 MG PO TABS
10.0000 mg | ORAL_TABLET | Freq: Every day | ORAL | Status: DC
  Administered 2017-02-18 (×2): 10 mg via ORAL
  Filled 2017-02-18 (×2): qty 1

## 2017-02-18 MED ORDER — LORATADINE 10 MG PO TABS: 10.0000 mg | ORAL_TABLET | Freq: Every day | ORAL | Status: DC

## 2017-02-18 MED ORDER — ALBUTEROL SULFATE HFA 108 (90 BASE) MCG/ACT IN AERS: 2.0000 | INHALATION_SPRAY | Freq: Four times a day (QID) | RESPIRATORY_TRACT | 0 refills | Status: DC | PRN

## 2017-02-18 MED ORDER — POTASSIUM CHLORIDE CRYS ER 20 MEQ PO TBCR
40.0000 meq | EXTENDED_RELEASE_TABLET | ORAL | Status: AC
  Administered 2017-02-18 (×2): 40 meq via ORAL
  Filled 2017-02-18 (×2): qty 2

## 2017-02-18 MED ORDER — IPRATROPIUM-ALBUTEROL 0.5-2.5 (3) MG/3ML IN SOLN: 3.0000 mL | Freq: Four times a day (QID) | RESPIRATORY_TRACT | 1 refills | Status: DC | PRN

## 2017-02-18 MED ORDER — PREDNISONE 20 MG PO TABS: 20.0000 mg | ORAL_TABLET | Freq: Every day | ORAL | 0 refills | Status: DC

## 2017-02-18 NOTE — Progress Notes (Signed)
Patient discharged to home with sister. All discharge instructions reviewed with patient. Patient received MATCH letter for scripts. All scripts reviewed. IV removed. Telemetry removed. Patient left unit in stable condition with all belongings in tow.  Avelina Laine RN
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
## 2017-02-18 NOTE — Care Management Note (Signed)
Case Management Note  Patient Details  Name: TORRES HARDENBROOK MRN: [id number] Date of Birth: 1991-01-27  Subjective/Objective:                 Patient admitted from home with parents for asthma exac. Patient established at [facility]. Patient in process of getting insurance throug employer, states it will start 03/07/17. Patient provided with [facility] letter. States he has been using a friends albuterol inhaler at home. Spoke with Dr Janee Morn. Informed patient need scripts and has MATCH. No further CM needs identified.    Action/Plan:  Anticipate DC today.  Expected Discharge Date:                  Expected Discharge Plan:  Home/Self Care  In-House Referral:     Discharge planning Services  CM Consult, MATCH Program  Post Acute Care Choice:    Choice offered to:     DME Arranged:    DME Agency:     HH Arranged:    HH Agency:     Status of Service:  Completed, signed off  If discussed at [employer] of Stay Meetings, dates discussed:    Additional Comments:  Lawerance Sabal, RN 02/18/2017, 4:57 PM

## 2017-02-24 ENCOUNTER — Ambulatory Visit: Payer: Self-pay | Admitting: Internal Medicine

## 2017-04-20 ENCOUNTER — Encounter: Payer: Self-pay | Admitting: Internal Medicine

## 2017-04-20 ENCOUNTER — Ambulatory Visit (INDEPENDENT_AMBULATORY_CARE_PROVIDER_SITE_OTHER): Payer: Self-pay | Admitting: Internal Medicine

## 2017-04-20 VITALS — BP 120/70 | HR 66 | Resp 12 | Ht 70.0 in | Wt 177.0 lb

## 2017-04-20 DIAGNOSIS — J3089 Other allergic rhinitis: Secondary | ICD-10-CM

## 2017-04-20 DIAGNOSIS — J4541 Moderate persistent asthma with (acute) exacerbation: Secondary | ICD-10-CM | POA: Insufficient documentation

## 2017-04-20 DIAGNOSIS — L2082 Flexural eczema: Secondary | ICD-10-CM | POA: Insufficient documentation

## 2017-04-20 MED ORDER — FLUTICASONE-SALMETEROL 250-50 MCG/DOSE IN AEPB: 1.0000 | INHALATION_SPRAY | Freq: Two times a day (BID) | RESPIRATORY_TRACT | 3 refills | Status: DC

## 2017-04-20 MED ORDER — PREDNISONE 10 MG PO TABS: ORAL_TABLET | ORAL | 0 refills | Status: DC

## 2017-04-20 MED ORDER — ALBUTEROL SULFATE HFA 108 (90 BASE) MCG/ACT IN AERS: 2.0000 | INHALATION_SPRAY | Freq: Four times a day (QID) | RESPIRATORY_TRACT | 2 refills | Status: DC | PRN

## 2017-04-20 MED ORDER — MOMETASONE FUROATE 50 MCG/ACT NA SUSP: NASAL | 12 refills | Status: DC

## 2017-04-20 MED ORDER — TRIAMCINOLONE ACETONIDE 0.1 % EX CREA: 1.0000 "application " | TOPICAL_CREAM | Freq: Two times a day (BID) | CUTANEOUS | 1 refills | Status: DC

## 2017-04-20 MED ORDER — FEXOFENADINE HCL 180 MG PO TABS: 180.0000 mg | ORAL_TABLET | Freq: Every day | ORAL | Status: DC

## 2017-04-20 NOTE — Progress Notes (Signed)
   Subjective:    Patient ID: Michael Clayton, male    DOB: 1991-01-29, 26 y.o.   MRN: [id number]  HPI   Still has not obtained orange card.    1.  Dental:  Did not get orange card, so spent a lot of money and went to dentist on own.  2.  Asthma:  Triggers are his allergies:  Pollen, cats, dogs, smoke.  Living with friends who smoke.  They do smoke in the house.  Living out of truck if unable to stay with friends. History of asthma more so when was younger.  Has not really treated in recent years as he did not have money to cover the medication or visits. States he ends up in ED a couple times yearly--fall or winter and spring.  Gets prednisone burst and taper. Was on Advair when younger.  Cannot recall if this decreased his frequency of exacerbations. Had ED visit in August of 2017 with exacerbation and April of this year.  4 days ago, developed tightness in chest and difficulty getting a deep breath or ability to fully exhale.   Has been using rescue inhaler or nebulizer every 4-6 hours.  Combinvent with nebulizer. Generally, uses rescue inhaler every day--2-3 times daily. Current Meds  Medication Sig  . albuterol (PROVENTIL HFA;VENTOLIN HFA) 108 (90 Base) MCG/ACT inhaler Inhale 2 puffs into the lungs every 6 (six) hours as needed for wheezing or shortness of breath.  Marland Kitchen. ipratropium-albuterol (DUONEB) 0.5-2.5 (3) MG/3ML SOLN Take 3 mLs by nebulization every 6 (six) hours as needed. Use 3 times daily x 3 days then every 6 hours as needed.  . [DISCONTINUED] albuterol (PROVENTIL HFA;VENTOLIN HFA) 108 (90 Base) MCG/ACT inhaler Inhale 2 puffs into the lungs every 6 (six) hours as needed for wheezing or shortness of breath.    No Known Allergies    Review of Systems     Objective:   Physical Exam  NAD Speaks in paragraphs without obvious dyspnea HEENT:  Conjunctivae injected with watery eyes bilaterally, TMs with fluid behind, Nasal mucosa swollen with clear discharge,  Posterior pharynx  with cobbling. Neck:  Supple, No adenopathy Lung:  Decrease BS with scattered wheeze throughout. CV:  RRR without murmur or rub, radial pulses normal and equal.  Good cap refill of fingers. Bumps covering antecubital fossae bilaterally     Assessment & Plan:  1.  Acute asthma exacerbation with chronic compliance issues due to homelessness and difficulties following through with treatment. [facility]  Starting Advair 250/50 1 inhalation twice daily.  Albuterol HFA 2 puffs every 4-6 hours as needed. Prednisone 40 mg daily for 5 days as get Advair on board. Control allergies.  2.  Allergies:  Fexofenadine 180 mg otc until has orange card, then Zyrtec through [facility].   Basically homeless, so unable to utilize pillow and mattress hypoallergenic covers currently. [facility]. Needs to be in a smoke free home.  3.  Eczema:  Triamcinolone cream 0.1% twice daily to affected areas  Return in 3 days after orange card sign up.

## 2017-04-23 ENCOUNTER — Ambulatory Visit: Payer: Self-pay | Admitting: Internal Medicine

## 2017-05-07 ENCOUNTER — Ambulatory Visit (INDEPENDENT_AMBULATORY_CARE_PROVIDER_SITE_OTHER): Payer: Self-pay | Admitting: Licensed Clinical Social Worker

## 2017-05-07 DIAGNOSIS — Z1331 Encounter for screening for depression: Secondary | ICD-10-CM

## 2017-05-07 DIAGNOSIS — Z1389 Encounter for screening for other disorder: Secondary | ICD-10-CM
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
## 2017-05-12 ENCOUNTER — Other Ambulatory Visit: Payer: Self-pay | Admitting: Licensed Clinical Social Worker

## 2017-05-14 ENCOUNTER — Other Ambulatory Visit: Payer: Self-pay | Admitting: Licensed Clinical Social Worker

## 2017-05-20 ENCOUNTER — Other Ambulatory Visit: Payer: Self-pay | Admitting: Licensed Clinical Social Worker

## 2017-05-24 ENCOUNTER — Emergency Department (HOSPITAL_COMMUNITY)
Admission: EM | Admit: 2017-05-24 | Discharge: 2017-05-24 | Disposition: A | Discharge status: Home / Self Care | Payer: Self-pay | Attending: Emergency Medicine | Admitting: Emergency Medicine

## 2017-05-24 ENCOUNTER — Encounter (HOSPITAL_COMMUNITY): Payer: Self-pay | Admitting: *Deleted

## 2017-05-24 DIAGNOSIS — J4521 Mild intermittent asthma with (acute) exacerbation: Secondary | ICD-10-CM

## 2017-05-24 DIAGNOSIS — J4531 Mild persistent asthma with (acute) exacerbation: Secondary | ICD-10-CM | POA: Insufficient documentation

## 2017-05-24 DIAGNOSIS — Z87891 Personal history of nicotine dependence: Secondary | ICD-10-CM | POA: Insufficient documentation

## 2017-05-24 MED ORDER — ALBUTEROL SULFATE (2.5 MG/3ML) 0.083% IN NEBU
5.0000 mg | INHALATION_SOLUTION | Freq: Once | RESPIRATORY_TRACT | Status: AC
  Administered 2017-05-24: 5 mg via RESPIRATORY_TRACT

## 2017-05-24 MED ORDER — PREDNISONE 20 MG PO TABS: 40.0000 mg | ORAL_TABLET | Freq: Every day | ORAL | 0 refills | Status: DC

## 2017-05-24 MED ORDER — ALBUTEROL SULFATE (2.5 MG/3ML) 0.083% IN NEBU
INHALATION_SOLUTION | RESPIRATORY_TRACT | Status: AC
  Filled 2017-05-24: qty 6

## 2017-05-24 NOTE — ED Triage Notes (Signed)
Pt c/o asthma and chest tightness onset tonight. Has used inhaler with some improvement

## 2017-05-24 NOTE — ED Notes (Signed)
See EDP secondary assessment.  

## 2017-05-24 NOTE — ED Provider Notes (Signed)
[facility]-EMERGENCY DEPT Provider Note   CSN: [id number] Arrival date & time: 05/24/17  2141  By signing my name below, I, Linna Darner, attest that this documentation has been prepared under the direction and in the presence of Terance Hart, PA-C. Electronically Signed: Linna Darner, Scribe. 05/24/2017. 11:11 PM.  History   Chief Complaint Chief Complaint  Patient presents with  . Asthma   The history is provided by the patient. No language interpreter was used.    HPI Comments: Michael Clayton is a 26 y.o. male with PMHx including asthma and environmental allergies who presents to the Emergency Department complaining of gradually worsening dyspnea, wheezing and chest tightness consistent with past asthma exacerbations ongoing for 3-4 days. 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 He has been using his albuterol inhaler at home with transient relief. Patient has received a breathing treatment here which has provided moderate improvement of his respiratory symptoms. He reports an associated headache. He denies chest pain, fevers, chills, or any other associated symptoms.  Past Medical History:  Diagnosis Date  . Asthma as a child   only using rescue inhaler.  Triggers are pollen allergies, cats, dogs, smoke    Patient Active Problem List   Diagnosis Date Noted  . Moderate persistent asthma with exacerbation 04/20/2017  . Environmental and seasonal allergies 04/20/2017  . Flexural eczema 04/20/2017  . Acute asthma exacerbation 02/17/2017    Past Surgical History:  Procedure Laterality Date  . EYE SURGERY Bilateral 2014   Detached retina on left with prophylactic laser surgery on the right as apparently at risk.       Home Medications    Prior to Admission medications   Medication Sig Start Date End Date Taking? Authorizing Provider  albuterol (PROVENTIL HFA;VENTOLIN HFA) 108 (90 Base) MCG/ACT  inhaler Inhale 2 puffs into the lungs every 6 (six) hours as needed for wheezing or shortness of breath. 04/20/17   Julieanne Manson, MD  fexofenadine (ALLEGRA) 180 MG tablet Take 1 tablet (180 mg total) by mouth daily. 04/20/17   Julieanne Manson, MD  Fluticasone-Salmeterol (ADVAIR DISKUS) 250-50 MCG/DOSE AEPB Inhale 1 puff into the lungs 2 (two) times daily. 04/20/17   Julieanne Manson, MD  ipratropium-albuterol (DUONEB) 0.5-2.5 (3) MG/3ML SOLN Take 3 mLs by nebulization every 6 (six) hours as needed. Use 3 times daily x 3 days then every 6 hours as needed. 02/18/17   Rodolph Bong, MD  loratadine (CLARITIN) 10 MG tablet Take 1 tablet (10 mg total) by mouth daily. Patient not taking: Reported on 04/20/2017 02/19/17   Rodolph Bong, MD  mometasone (NASONEX) 50 MCG/ACT nasal spray 2 sprays each nostril daily 04/20/17   Julieanne Manson, MD  predniSONE (DELTASONE) 10 MG tablet 4 tabs by mouth once daily for 5 days. 04/20/17   Julieanne Manson, MD  triamcinolone cream (KENALOG) 0.1 % Apply 1 application topically 2 (two) times daily. 04/20/17   Julieanne Manson, MD    Family History Family History  Problem Relation Age of Onset  . Peptic Ulcer Mother        History of bleeding ulcer  . Sickle cell trait Brother     Social History Social History  Substance Use Topics  . Smoking status: Former Smoker    Packs/day: 0.20    Years: 1.00    Types: Cigarettes    Start date: 05/23/2007    Quit date: 05/22/2008  .  Smokeless tobacco: Never Used  . Alcohol use 1.5 oz/week    3 Standard drinks or equivalent per week     Comment: --once on weekend     Allergies   Patient has no known allergies.   Review of Systems Review of Systems  Constitutional: Negative for chills and fever.  HENT: Negative for congestion, rhinorrhea and sore throat.   Respiratory: Positive for chest tightness, shortness of breath and wheezing.   Cardiovascular: Negative for chest pain.    Neurological: Positive for headaches.   Physical Exam Updated Vital Signs BP 140/69   Pulse 76   Temp 98 F (36.7 C) (Oral)   Resp 20   SpO2 97%   Physical Exam  Constitutional: He is oriented to person, place, and time. He appears well-developed and well-nourished. No distress.  HENT:  Head: Normocephalic and atraumatic.  Right Ear: Tympanic membrane, external ear and ear canal normal.  Left Ear: Tympanic membrane, external ear and ear canal normal.  Nose: Nose normal.  Mouth/Throat: Uvula is midline, oropharynx is clear and moist and mucous membranes are normal.  Eyes: Conjunctivae and EOM are normal.  Neck: Neck supple. No tracheal deviation present.  Cardiovascular: Normal rate and regular rhythm.  Exam reveals no gallop and no friction rub.   No murmur heard. Pulmonary/Chest: Effort normal and breath sounds normal. No respiratory distress. He has no wheezes. He has no rales. He exhibits no tenderness.  Musculoskeletal: Normal range of motion.  Neurological: He is alert and oriented to person, place, and time.  Skin: Skin is warm and dry.  Psychiatric: He has a normal mood and affect. His behavior is normal.  Nursing note and vitals reviewed.  ED Treatments / Results  Labs (all labs ordered are listed, but only abnormal results are displayed) Labs Reviewed - No data to display  EKG  EKG Interpretation None       Radiology No results found.  Procedures Procedures (including critical care time)  DIAGNOSTIC STUDIES: Oxygen Saturation is 97% on RA, normal by my interpretation.    COORDINATION OF CARE: 11:09 PM Discussed treatment plan with pt at bedside and pt agreed to plan.  Medications Ordered in ED Medications  albuterol (PROVENTIL) (2.5 MG/3ML) 0.083% nebulizer solution (not administered)  albuterol (PROVENTIL) (2.5 MG/3ML) 0.083% nebulizer solution 5 mg (5 mg Nebulization Given 05/24/17 2147)     Initial Impression / Assessment and Plan / ED Course   I have reviewed the triage vital signs and the nursing notes.  Pertinent labs & imaging results that were available during my care of the patient were reviewed by me and considered in my medical decision making (see chart for details).  26 year old with mild asthma exacerbation. After a breathing tx he had significant improvement in breathing. Lungs are CTA. Vitals are normal. Will d/c with course of steroids. Also advised fluids and NSAIDs for headache. He verbalized understanding. Return precautions given.  Final Clinical Impressions(s) / ED Diagnoses   Final diagnoses:  Mild intermittent asthma with exacerbation    New Prescriptions New Prescriptions   No medications on file   I personally performed the services described in this documentation, which was scribed in my presence. The recorded information has been reviewed and is accurate.    Bethel BornGekas, Ronson Hagins Marie, PA-C 05/25/17 Bernita Buffy0016    Jerelyn ScottLinker, Martha, MD 05/27/17 [phone]

## 2017-05-24 NOTE — Discharge Instructions (Signed)
Take steroid for the next 5 days Take Ibuprofen or Naproxen for headache and drink plenty of fluids Return for worsening symptoms

## 2017-05-26 ENCOUNTER — Ambulatory Visit: Payer: Self-pay | Admitting: Internal Medicine

## 2017-08-12 ENCOUNTER — Other Ambulatory Visit: Payer: Self-pay | Admitting: Internal Medicine

## 2017-08-12 MED ORDER — ALBUTEROL SULFATE HFA 108 (90 BASE) MCG/ACT IN AERS: 2.0000 | INHALATION_SPRAY | Freq: Four times a day (QID) | RESPIRATORY_TRACT | 0 refills | Status: DC | PRN

## 2017-08-18 NOTE — Telephone Encounter (Signed)
Patient phone number is disconnected.

## 2017-09-03 ENCOUNTER — Other Ambulatory Visit: Payer: Self-pay

## 2017-09-03 MED ORDER — FLUTICASONE-SALMETEROL 250-50 MCG/DOSE IN AEPB: 1.0000 | INHALATION_SPRAY | Freq: Two times a day (BID) | RESPIRATORY_TRACT | 3 refills | Status: DC

## 2021-02-26 NOTE — Congregational Nurse Program (Signed)
  Dept: [phone]   Congregational Nurse Program Note  Date of Encounter: 02/26/2021  Past Medical History: Past Medical History:  Diagnosis Date  . Asthma as a child   only using rescue inhaler.  Triggers are pollen allergies, cats, dogs, smoke    Encounter Details:  CNP Questionnaire - 02/26/21 1200      Questionnaire   Do you give verbal consent to treat you today? Yes    Visit Setting Other    Location Patient Served At [facility] [address]    Patient Status Homeless    Medical Provider No    Insurance Medicaid    Intervention Counsel;Educate;Refer;Support    Housing/Utilities No permanent housing    Referrals PCP - [place];[facility] - [place]         Initial visit for this young father with his 58 year old daughter. States he has custody but not legally ,mother and him communicate and he is raising his daughter at this time . Commended . He is complaining of back pains that will not go away ,states he thinks he injured his back in the gym  Around January 2022,pain hasn't  stopped depends on his movement the paiand n returns and he cant turn a certain way taking no medications ,having some allergy concerns today . Marland Kitchen No other health concerns . 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 Mother lives in town and is supportive ,has only one child.. plan to attend [facility] for blood work and to establish care and referral if deemed necessary regarding back pains   Nurse will  follow up next week .  @ Blood sugar today at 3:04 pm was 98 mg counseled on normal result.

## 2021-04-05 ENCOUNTER — Emergency Department (HOSPITAL_COMMUNITY)
Admission: EM | Admit: 2021-04-05 | Discharge: 2021-04-05 | Disposition: A | Discharge status: Home / Self Care | Payer: Medicaid Other | Attending: Emergency Medicine | Admitting: Emergency Medicine

## 2021-04-05 ENCOUNTER — Encounter (HOSPITAL_COMMUNITY): Payer: Self-pay

## 2021-04-05 ENCOUNTER — Other Ambulatory Visit: Payer: Self-pay

## 2021-04-05 DIAGNOSIS — J4541 Moderate persistent asthma with (acute) exacerbation: Secondary | ICD-10-CM | POA: Insufficient documentation

## 2021-04-05 DIAGNOSIS — Z7951 Long term (current) use of inhaled steroids: Secondary | ICD-10-CM | POA: Diagnosis not present

## 2021-04-05 DIAGNOSIS — M545 Low back pain, unspecified: Secondary | ICD-10-CM

## 2021-04-05 DIAGNOSIS — Z87891 Personal history of nicotine dependence: Secondary | ICD-10-CM | POA: Diagnosis not present

## 2021-04-05 MED ORDER — ACETAMINOPHEN 325 MG PO TABS
650.0000 mg | ORAL_TABLET | Freq: Once | ORAL | Status: AC
  Administered 2021-04-05: 650 mg via ORAL
  Filled 2021-04-05: qty 2

## 2021-04-05 MED ORDER — IBUPROFEN 200 MG PO TABS
600.0000 mg | ORAL_TABLET | Freq: Once | ORAL | Status: AC
  Administered 2021-04-05: 600 mg via ORAL
  Filled 2021-04-05: qty 3

## 2021-04-05 MED ORDER — IBUPROFEN 400 MG PO TABS: 400.0000 mg | ORAL_TABLET | Freq: Four times a day (QID) | ORAL | 0 refills | Status: DC | PRN

## 2021-04-05 NOTE — Discharge Instructions (Addendum)
Call your primary care doctor or specialist as discussed in the next 2-3 days.   Return immediately back to the ER if:  Your symptoms worsen within the next 12-24 hours. You develop new symptoms such as new fevers, persistent vomiting, new pain, shortness of breath, or new weakness or numbness, or if you have any other concerns.  

## 2021-04-05 NOTE — ED Triage Notes (Signed)
Patient c/o mid and left lower back pain since February. Patient reports pain worse in the past few weeks.

## 2021-04-05 NOTE — ED Provider Notes (Signed)
[facility]-EMERGENCY DEPT Provider Note   CSN: [id number] Arrival date & time: 04/05/21  1011     History Chief Complaint  Patient presents with  . Back Pain    Michael Clayton is a 30 y.o. male.  Patient presents chief complaint of lower back pain.  He states that this occurred approximately 3 to 4 months ago.  He was at the gym doing jumping jacks when he had sudden sharp lower back pain.  Since that time he is daily lower back pain that is unchanged over the course of the last 3 to 4 months.  Describes as a persistent ache worse when he moves a certain way and worse when he bends over.  Denies any new numbness or weakness.  Denies any bowel or bladder dysfunction or loss of control.  He states that he just grew frustrated that his been having this persistent pain and presents to the ER.  Otherwise denies any abnormal weight loss or IV drug use.        Past Medical History:  Diagnosis Date  . Asthma as a child   only using rescue inhaler.  Triggers are pollen allergies, cats, dogs, smoke    Patient Active Problem List   Diagnosis Date Noted  . Moderate persistent asthma with exacerbation 04/20/2017  . Environmental and seasonal allergies 04/20/2017  . Flexural eczema 04/20/2017  . Acute asthma exacerbation 02/17/2017    Past Surgical History:  Procedure Laterality Date  . EYE SURGERY Bilateral 2014   Detached retina on left with prophylactic laser surgery on the right as apparently at risk.       Family History  Problem Relation Age of Onset  . Peptic Ulcer Mother        History of bleeding ulcer  . Sickle cell trait Brother     Social History   Tobacco Use  . Smoking status: Former Smoker    Packs/day: 0.20    Years: 1.00    Pack years: 0.20    Types: Cigarettes    Start date: 05/23/2007    Quit date: 05/22/2008    Years since quitting: 12.8  . Smokeless tobacco: Never Used  Vaping Use  . Vaping Use: Never used  Substance Use  Topics  . Alcohol use: Yes    Alcohol/week: 3.0 standard drinks    Types: 3 Standard drinks or equivalent per week    Comment: --once on weekend  . Drug use: No    Home Medications Prior to Admission medications   Medication Sig Start Date End Date Taking? Authorizing Provider  ibuprofen (ADVIL) 400 MG tablet Take 1 tablet (400 mg total) by mouth every 6 (six) hours as needed. 04/05/21  Yes Cheryll Cockayne, MD  albuterol (PROVENTIL HFA;VENTOLIN HFA) 108 (90 Base) MCG/ACT inhaler Inhale 2 puffs into the lungs every 6 (six) hours as needed for wheezing or shortness of breath. 08/12/17   Julieanne Manson, MD  fexofenadine (ALLEGRA) 180 MG tablet Take 1 tablet (180 mg total) by mouth daily. 04/20/17   Julieanne Manson, MD  Fluticasone-Salmeterol (ADVAIR DISKUS) 250-50 MCG/DOSE AEPB Inhale 1 puff into the lungs 2 (two) times daily. 09/03/17   Julieanne Manson, MD  ipratropium-albuterol (DUONEB) 0.5-2.5 (3) MG/3ML SOLN Take 3 mLs by nebulization every 6 (six) hours as needed. Use 3 times daily x 3 days then every 6 hours as needed. 02/18/17   Rodolph Bong, MD  loratadine (CLARITIN) 10 MG tablet Take 1 tablet (10 mg total) by  mouth daily. Patient not taking: Reported on 04/20/2017 02/19/17   Rodolph Bong, MD  mometasone (NASONEX) 50 MCG/ACT nasal spray 2 sprays each nostril daily 04/20/17   Julieanne Manson, MD  predniSONE (DELTASONE) 20 MG tablet Take 2 tablets (40 mg total) by mouth daily. 05/24/17   Bethel Born, PA-C  triamcinolone cream (KENALOG) 0.1 % Apply 1 application topically 2 (two) times daily. 04/20/17   Julieanne Manson, MD    Allergies    Patient has no known allergies.  Review of Systems   Review of Systems  Constitutional: Negative for fever.  HENT: Negative for ear pain and sore throat.   Eyes: Negative for pain.  Respiratory: Negative for cough.   Cardiovascular: Negative for chest pain.  Gastrointestinal: Negative for abdominal pain.   Genitourinary: Negative for flank pain.  Musculoskeletal: Positive for back pain.  Skin: Negative for color change and rash.  Neurological: Negative for syncope.  All other systems reviewed and are negative.   Physical Exam Updated Vital Signs BP 114/73 (BP Location: Right Arm)   Pulse 70   Temp 98 F (36.7 C) (Oral)   Resp 16   Ht 6' (1.829 m)   Wt 81.6 kg   SpO2 98%   BMI 24.41 kg/m   Physical Exam Constitutional:      General: He is not in acute distress.    Appearance: He is well-developed.  HENT:     Head: Normocephalic.     Nose: Nose normal.  Eyes:     Extraocular Movements: Extraocular movements intact.  Cardiovascular:     Rate and Rhythm: Normal rate.  Pulmonary:     Effort: Pulmonary effort is normal.  Skin:    Coloration: Skin is not jaundiced.  Neurological:     General: No focal deficit present.     Mental Status: He is alert and oriented to person, place, and time. Mental status is at baseline.     Cranial Nerves: No cranial nerve deficit.     Sensory: No sensory deficit.     Motor: No weakness.     Coordination: Coordination normal.     Gait: Gait normal.     ED Results / Procedures / Treatments   Labs (all labs ordered are listed, but only abnormal results are displayed) Labs Reviewed - No data to display  EKG None  Radiology No results found.  Procedures Procedures   Medications Ordered in ED Medications  acetaminophen (TYLENOL) tablet 650 mg (has no administration in time range)  ibuprofen (ADVIL) tablet 600 mg (has no administration in time range)    ED Course  I have reviewed the triage vital signs and the nursing notes.  Pertinent labs & imaging results that were available during my care of the patient were reviewed by me and considered in my medical decision making (see chart for details).    MDM Rules/Calculators/A&P                          Patient denies any saddle anesthesia.  He has a normal neuro exam.  No  significant tenderness on the L-spine exam.  Will recommend outpatient follow-up with his doctor within the week.  Patient otherwise states he has not tried any Tylenol or Motrin or any pain medication for his lower back pain.  Advised him to take some Motrin in the meanwhile.  Advised immediate return for new numbness weakness fevers worsening symptoms or any additional concerns.  Final  Clinical Impression(s) / ED Diagnoses Final diagnoses:  Acute midline low back pain without sciatica    Rx / DC Orders ED Discharge Orders         Ordered    ibuprofen (ADVIL) 400 MG tablet  Every 6 hours PRN        04/05/21 1058           Cheryll Cockayne, MD 04/05/21 1058

## 2021-06-21 DIAGNOSIS — M545 Low back pain, unspecified: Secondary | ICD-10-CM | POA: Insufficient documentation

## 2021-07-29 ENCOUNTER — Other Ambulatory Visit: Payer: Self-pay

## 2021-07-29 ENCOUNTER — Encounter: Payer: Self-pay | Admitting: Emergency Medicine

## 2021-07-29 ENCOUNTER — Ambulatory Visit
Admission: EM | Admit: 2021-07-29 | Discharge: 2021-07-29 | Disposition: A | Discharge status: Home / Self Care | Payer: Medicaid Other | Attending: Urgent Care | Admitting: Urgent Care

## 2021-07-29 DIAGNOSIS — M5442 Lumbago with sciatica, left side: Secondary | ICD-10-CM

## 2021-07-29 DIAGNOSIS — M5441 Lumbago with sciatica, right side: Secondary | ICD-10-CM

## 2021-07-29 MED ORDER — PREDNISONE 20 MG PO TABS: ORAL_TABLET | ORAL | 0 refills | Status: DC

## 2021-07-29 NOTE — ED Triage Notes (Signed)
Started a new job where he has to stand and it's exacerbated his lower back pain. Requesting something to get him through until he can see his physical therapist in a few days.

## 2021-07-29 NOTE — ED Provider Notes (Signed)
[facility]   MRN: [id number] DOB: [birth year]  Subjective:   Michael Clayton is a 30 y.o. male presenting for persistent low back pain radiating into both legs.  Patient states that he has been working with [facility].  They did an x-ray and was completely normal.  They recommended physical therapy.  Has been using meloxicam and a muscle relaxant without any relief.  Denies any particular trauma, falls.  Reports that he has not noticed any changes to bowel or urinary habits.  No weakness, numbness or tingling.  No current facility-administered medications for this encounter.  Current Outpatient Medications:    albuterol (PROVENTIL HFA;VENTOLIN HFA) 108 (90 Base) MCG/ACT inhaler, Inhale 2 puffs into the lungs every 6 (six) hours as needed for wheezing or shortness of breath., Disp: 1 Inhaler, Rfl: 0   fexofenadine (ALLEGRA) 180 MG tablet, Take 1 tablet (180 mg total) by mouth daily., Disp: , Rfl:    Fluticasone-Salmeterol (ADVAIR DISKUS) 250-50 MCG/DOSE AEPB, Inhale 1 puff into the lungs 2 (two) times daily., Disp: 1 each, Rfl: 3   ibuprofen (ADVIL) 400 MG tablet, Take 1 tablet (400 mg total) by mouth every 6 (six) hours as needed., Disp: 30 tablet, Rfl: 0   ipratropium-albuterol (DUONEB) 0.5-2.5 (3) MG/3ML SOLN, Take 3 mLs by nebulization every 6 (six) hours as needed. Use 3 times daily x 3 days then every 6 hours as needed., Disp: 360 mL, Rfl: 1   loratadine (CLARITIN) 10 MG tablet, Take 1 tablet (10 mg total) by mouth daily. (Patient not taking: Reported on 04/20/2017), Disp: , Rfl:    mometasone (NASONEX) 50 MCG/ACT nasal spray, 2 sprays each nostril daily, Disp: 17 g, Rfl: 12   predniSONE (DELTASONE) 20 MG tablet, Take 2 tablets (40 mg total) by mouth daily., Disp: 10 tablet, Rfl: 0   triamcinolone cream (KENALOG) 0.1 %, Apply 1 application topically 2 (two) times daily., Disp: 80 g, Rfl: 1   No Known Allergies  Past Medical History:  Diagnosis Date   Asthma as a  child   only using rescue inhaler.  Triggers are pollen allergies, cats, dogs, smoke     Past Surgical History:  Procedure Laterality Date   EYE SURGERY Bilateral 2014   Detached retina on left with prophylactic laser surgery on the right as apparently at risk.    Family History  Problem Relation Age of Onset   Peptic Ulcer Mother        History of bleeding ulcer   Sickle cell trait Brother     Social History   Tobacco Use   Smoking status: Former    Packs/day: 0.20    Years: 1.00    Pack years: 0.20    Types: Cigarettes    Start date: 05/23/2007    Quit date: 05/22/2008    Years since quitting: 13.1   Smokeless tobacco: Never  Vaping Use   Vaping Use: Never used  Substance Use Topics   Alcohol use: Yes    Alcohol/week: 3.0 standard drinks    Types: 3 Standard drinks or equivalent per week    Comment: --once on weekend   Drug use: No    ROS   Objective:   Vitals: BP 113/70 (BP Location: Right Arm)   Pulse 73   Temp 97.9 F (36.6 C) (Oral)   Resp 18   SpO2 96%   Physical Exam Constitutional:      General: He is not in acute distress.    Appearance: Normal appearance. He  is well-developed and normal weight. He is not ill-appearing, toxic-appearing or diaphoretic.  HENT:     Head: Normocephalic and atraumatic.     Right Ear: External ear normal.     Left Ear: External ear normal.     Nose: Nose normal.     Mouth/Throat:     Pharynx: Oropharynx is clear.  Eyes:     General: No scleral icterus.       Right eye: No discharge.        Left eye: No discharge.     Extraocular Movements: Extraocular movements intact.     Pupils: Pupils are equal, round, and reactive to light.  Cardiovascular:     Rate and Rhythm: Normal rate.  Pulmonary:     Effort: Pulmonary effort is normal.  Musculoskeletal:     Cervical back: Normal range of motion.     Lumbar back: Tenderness (mild, over bilateral back) present. No swelling, edema, deformity, signs of trauma,  lacerations, spasms or bony tenderness. Normal range of motion. Positive right straight leg raise test and positive left straight leg raise test. No scoliosis.  Neurological:     Mental Status: He is alert and oriented to person, place, and time.     Motor: No weakness.     Coordination: Coordination normal.     Gait: Gait normal.     Deep Tendon Reflexes: Reflexes normal.  Psychiatric:        Mood and Affect: Mood normal.        Behavior: Behavior normal.        Thought Content: Thought content normal.        Judgment: Judgment normal.    Assessment and Plan :   PDMP not reviewed this encounter.  1. Acute bilateral low back pain with bilateral sciatica     Patient has not responded to NSAIDs, physical therapy.  Recommended an oral prednisone course.  Offered him a different muscle relaxant which she declined.  He is in need of an MRI but can be obtained through his orthopedist, [facility].  He has a follow-up appointment coming up soon and emphasized need to advocate for himself for this.  Offered him a note for work but he said he did not need one. Counseled patient on potential for adverse effects with medications prescribed/recommended today, ER and return-to-clinic precautions discussed, patient verbalized understanding.    Wallis Bamberg, [place] 07/29/21 1811

## 2021-07-30 ENCOUNTER — Other Ambulatory Visit: Payer: Self-pay | Admitting: Physician Assistant

## 2021-07-30 DIAGNOSIS — S335XXD Sprain of ligaments of lumbar spine, subsequent encounter: Secondary | ICD-10-CM

## 2021-08-28 ENCOUNTER — Encounter (HOSPITAL_COMMUNITY): Payer: Self-pay

## 2021-08-28 ENCOUNTER — Emergency Department (HOSPITAL_COMMUNITY): Payer: Medicaid Other

## 2021-08-28 ENCOUNTER — Emergency Department (HOSPITAL_COMMUNITY)
Admission: EM | Admit: 2021-08-28 | Discharge: 2021-08-29 | Disposition: A | Discharge status: Home / Self Care | Payer: Medicaid Other | Attending: Emergency Medicine | Admitting: Emergency Medicine

## 2021-08-28 DIAGNOSIS — Z87891 Personal history of nicotine dependence: Secondary | ICD-10-CM | POA: Diagnosis not present

## 2021-08-28 DIAGNOSIS — M25572 Pain in left ankle and joints of left foot: Secondary | ICD-10-CM | POA: Diagnosis not present

## 2021-08-28 DIAGNOSIS — S82832A Other fracture of upper and lower end of left fibula, initial encounter for closed fracture: Secondary | ICD-10-CM | POA: Insufficient documentation

## 2021-08-28 DIAGNOSIS — J4541 Moderate persistent asthma with (acute) exacerbation: Secondary | ICD-10-CM | POA: Insufficient documentation

## 2021-08-28 DIAGNOSIS — Z7951 Long term (current) use of inhaled steroids: Secondary | ICD-10-CM | POA: Insufficient documentation

## 2021-08-28 DIAGNOSIS — W19XXXA Unspecified fall, initial encounter: Secondary | ICD-10-CM | POA: Diagnosis not present

## 2021-08-28 DIAGNOSIS — S8992XA Unspecified injury of left lower leg, initial encounter: Secondary | ICD-10-CM | POA: Diagnosis present

## 2021-08-28 DIAGNOSIS — S82892A Other fracture of left lower leg, initial encounter for closed fracture: Secondary | ICD-10-CM

## 2021-08-28 DIAGNOSIS — Y9355 Activity, bike riding: Secondary | ICD-10-CM | POA: Insufficient documentation

## 2021-08-28 MED ORDER — OXYCODONE-ACETAMINOPHEN 5-325 MG PO TABS
1.0000 | ORAL_TABLET | Freq: Once | ORAL | Status: AC
  Administered 2021-08-28: 21:00:00 1 via ORAL
  Filled 2021-08-28: qty 1
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
## 2021-08-29 ENCOUNTER — Emergency Department (HOSPITAL_COMMUNITY): Payer: Medicaid Other

## 2021-08-29 DIAGNOSIS — S82842A Displaced bimalleolar fracture of left lower leg, initial encounter for closed fracture: Secondary | ICD-10-CM | POA: Insufficient documentation

## 2021-08-29 HISTORY — DX: Displaced bimalleolar fracture of left lower leg, initial encounter for closed fracture: S82.842A

## 2021-08-29 MED ORDER — FENTANYL CITRATE PF 50 MCG/ML IJ SOSY
100.0000 ug | PREFILLED_SYRINGE | Freq: Once | INTRAMUSCULAR | Status: AC
  Administered 2021-08-29: 100 ug via INTRAVENOUS
  Filled 2021-08-29: qty 2

## 2021-08-29 MED ORDER — MORPHINE SULFATE 15 MG PO TABS: 7.5000 mg | ORAL_TABLET | ORAL | 0 refills | Status: DC | PRN

## 2021-08-29 MED ORDER — LIDOCAINE-EPINEPHRINE (PF) 2 %-1:200000 IJ SOLN
10.0000 mL | Freq: Once | INTRAMUSCULAR | Status: AC
  Administered 2021-08-29: 10 mL via INTRADERMAL
  Filled 2021-08-29: qty 20

## 2021-08-29 NOTE — Progress Notes (Signed)
Orthopedic Tech Progress Note Patient Details:  Michael Clayton December 06, 1990 [id number]  Ortho Devices Type of Ortho Device: Crutches, Short leg splint, Stirrup splint Ortho Device/Splint Location: LLE Ortho Device/Splint Interventions: Ordered, Application, Adjustment   Post Interventions Patient Tolerated: Well Instructions Provided: Adjustment of device, Care of device, Poper ambulation with device  [name] 08/29/2021, 3:04 AM
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
## 2021-08-29 NOTE — ED Notes (Signed)
Ortho called 

## 2021-08-29 NOTE — Discharge Instructions (Addendum)
Take 4 over the counter ibuprofen tablets 3 times a day or 2 over-the-counter naproxen tablets twice a day for pain. Also take tylenol 1000mg (2 extra strength) four times a day.   Then take the pain medicine if you feel like you need it. Narcotics do not help with the pain, they only make you care about it less.  You can become addicted to this, people may break into your house to steal it.  It will constipate you.  If you drive under the influence of this medicine you can get a DUI.    Keep the ankle elevated above your heart as best you can.  Ice 15 minutes on 15 minutes off for an hour for the next couple days.  Please give the orthopedic doctor a call on the phone tomorrow to set up an appointment.

## 2021-08-30 ENCOUNTER — Other Ambulatory Visit: Payer: Self-pay

## 2021-08-30 ENCOUNTER — Other Ambulatory Visit: Payer: Self-pay | Admitting: Orthopaedic Surgery

## 2021-08-30 ENCOUNTER — Ambulatory Visit
Admission: RE | Admit: 2021-08-30 | Discharge: 2021-08-30 | Disposition: A | Discharge status: Home / Self Care | Payer: Medicaid Other | Source: Ambulatory Visit | Attending: Orthopaedic Surgery | Admitting: Orthopaedic Surgery

## 2021-08-30 DIAGNOSIS — S82842A Displaced bimalleolar fracture of left lower leg, initial encounter for closed fracture: Secondary | ICD-10-CM

## 2021-09-03 ENCOUNTER — Other Ambulatory Visit: Payer: Self-pay

## 2021-09-03 ENCOUNTER — Encounter (HOSPITAL_BASED_OUTPATIENT_CLINIC_OR_DEPARTMENT_OTHER): Payer: Self-pay | Admitting: Orthopaedic Surgery

## 2021-09-03 NOTE — Discharge Instructions (Addendum)
Netta Cedars, MD [facility]  Please read the following information regarding your care after surgery.  Please pick up your postop medications at: [id number]): [address] [place], [place] [address],  Ph [phone], Fax [phone]  Medications  You only need a prescription for the narcotic pain medicine (ex. oxycodone, Percocet, Norco).  All of the other medicines listed below are available over the counter. ? Aleve 2 pills twice a day for the first 3 days after surgery. ? acetominophen (Tylenol) 650 mg every 4-6 hours as you need for minor to moderate pain ? oxycodone as prescribed for severe pain Start taking one aspirin a day either starting today 09/04/21 or tomorrow 09/05/21  ? To help prevent blood clots, take aspirin (325 mg) daily for 30 days after surgery.  You should also get up every hour while you are awake to move around.    Weight Bearing ? Do not bear any weight on the operated leg or foot.  Cast / Splint / Dressing ? If you have a splint, do NOT remove this. Keep your splint, cast or dressing clean and dry.  Don't put anything (coat hanger, pencil, etc) down inside of it.  If it gets damp, use a hair dryer on the cool setting to dry it.  If it gets soaked, call the office to schedule an appointment for a cast change.  Swelling IMPORTANT: It is normal for you to have swelling where you had surgery. To reduce swelling and pain, keep at least 3 pillows under your leg so that your toes are above your nose and your heel is above the level of your hip.  It may be necessary to keep your foot or leg elevated for several weeks.  This is critical to helping your incisions heal and your pain to feel better.  Follow Up Call my office at [phone] when you are discharged from the hospital or surgery center to schedule an appointment to be seen 2-3 weeks after surgery.  Call my office at [phone] if you develop a fever >101.5 F, nausea, vomiting,  bleeding from the surgical site or severe pain.        Regional Anesthesia Blocks  1. Numbness or the inability to move the "blocked" extremity may last from 3-48 hours after placement. The length of time depends on the medication injected and your individual response to the medication. If the numbness is not going away after 48 hours, call your surgeon.  2. The extremity that is blocked will need to be protected until the numbness is gone and the  Strength has returned. Because you cannot feel it, you will need to take extra care to avoid injury. Because it may be weak, you may have difficulty moving it or using it. You may not know what position it is in without looking at it while the block is in effect.  3. For blocks in the legs and feet, returning to weight bearing and walking needs to be done carefully. You will need to wait until the numbness is entirely gone and the strength has returned. You should be able to move your leg and foot normally before you try and bear weight or walk. You will need someone to be with you when you first try to ensure you do not fall and possibly risk injury.  4. Bruising and tenderness at the needle site are common side effects and will resolve in a few days.  5. Persistent numbness or new problems with movement  should be communicated to the surgeon or the [facility] [phone] [facility] [phone]).      Post Anesthesia Home Care Instructions  Activity: Get plenty of rest for the remainder of the day. A responsible individual must stay with you for 24 hours following the procedure.  For the next 24 hours, DO NOT: -Drive a car -Advertising copywriter -Drink alcoholic beverages -Take any medication unless instructed by your physician -Make any legal decisions or sign important papers.  Meals: Start with liquid foods such as gelatin or soup. Progress to regular foods as tolerated. Avoid greasy, spicy, heavy foods. If  nausea and/or vomiting occur, drink only clear liquids until the nausea and/or vomiting subsides. Call your physician if vomiting continues.  Special Instructions/Symptoms: Your throat may feel dry or sore from the anesthesia or the breathing tube placed in your throat during surgery. If this causes discomfort, gargle with warm salt water. The discomfort should disappear within 24 hours.    No acetaminophen/Tylenol until after 3:00pm today if needed.

## 2021-09-03 NOTE — Progress Notes (Addendum)
Spoke w/ via phone for pre-op interview---pt Lab needs dos----     none per anesthesia surgery orders pending         Lab results------none COVID test -----patient states asymptomatic no test needed Arrive at -------900 am 07-05-2021 NPO after MN NO Solid Food.  Clear liquids from MN until---800 am Med rec completed Medications to take morning of surgery -----es tylenol prn Diabetic medication -----n/a Patient instructed to bring photo id and insurance card day of surgery Patient aware to have Driver (ride ) / caregiver    for 24 hours after surgery  cousin lemul smith will stay Patient Special Instructions -----bring crutches and leg scooter day of surgery, pt using these to ambulate Pre-Op special Istructions -----surgery orders req dr Claudine Mouton epic ib Patient verbalized understanding of instructions that were given at this phone interview. Patient denies shortness of breath, chest pain, fever, cough at this phone interview.

## 2021-09-04 ENCOUNTER — Ambulatory Visit (HOSPITAL_BASED_OUTPATIENT_CLINIC_OR_DEPARTMENT_OTHER): Payer: Medicaid Other | Admitting: Anesthesiology

## 2021-09-04 ENCOUNTER — Ambulatory Visit (HOSPITAL_BASED_OUTPATIENT_CLINIC_OR_DEPARTMENT_OTHER)
Admission: RE | Admit: 2021-09-04 | Discharge: 2021-09-04 | Disposition: A | Discharge status: Home / Self Care | Payer: Medicaid Other | Attending: Orthopaedic Surgery | Admitting: Orthopaedic Surgery

## 2021-09-04 ENCOUNTER — Other Ambulatory Visit: Payer: Self-pay

## 2021-09-04 ENCOUNTER — Encounter (HOSPITAL_BASED_OUTPATIENT_CLINIC_OR_DEPARTMENT_OTHER): Payer: Self-pay | Admitting: Orthopaedic Surgery

## 2021-09-04 ENCOUNTER — Encounter (HOSPITAL_BASED_OUTPATIENT_CLINIC_OR_DEPARTMENT_OTHER)
Admission: RE | Disposition: A | Discharge status: Home / Self Care | Payer: Self-pay | Source: Home / Self Care | Attending: Orthopaedic Surgery

## 2021-09-04 DIAGNOSIS — S8262XA Displaced fracture of lateral malleolus of left fibula, initial encounter for closed fracture: Secondary | ICD-10-CM | POA: Insufficient documentation

## 2021-09-04 DIAGNOSIS — S82892A Other fracture of left lower leg, initial encounter for closed fracture: Secondary | ICD-10-CM | POA: Diagnosis present

## 2021-09-04 DIAGNOSIS — Z9889 Other specified postprocedural states: Secondary | ICD-10-CM

## 2021-09-04 DIAGNOSIS — Z87891 Personal history of nicotine dependence: Secondary | ICD-10-CM | POA: Insufficient documentation

## 2021-09-04 HISTORY — DX: Anxiety disorder, unspecified: F41.9

## 2021-09-04 HISTORY — PX: ORIF ANKLE FRACTURE: SHX5408

## 2021-09-04 HISTORY — DX: Other visual disturbances: H53.8

## 2021-09-04 HISTORY — DX: Other fracture of left lower leg, initial encounter for closed fracture: S82.892A

## 2021-09-04 HISTORY — DX: Attention-deficit hyperactivity disorder, unspecified type: F90.9

## 2021-09-04 SURGERY — OPEN REDUCTION INTERNAL FIXATION (ORIF) ANKLE FRACTURE
Anesthesia: Regional | Site: Ankle | Laterality: Left

## 2021-09-04 MED ORDER — FENTANYL CITRATE (PF) 100 MCG/2ML IJ SOLN
INTRAMUSCULAR | Status: AC
  Filled 2021-09-04: qty 2

## 2021-09-04 MED ORDER — OXYCODONE HCL 5 MG PO TABS: 5.0000 mg | ORAL_TABLET | Freq: Once | ORAL | Status: DC | PRN

## 2021-09-04 MED ORDER — KETOROLAC TROMETHAMINE 30 MG/ML IJ SOLN: 30.0000 mg | Freq: Once | INTRAMUSCULAR | Status: DC | PRN

## 2021-09-04 MED ORDER — LIDOCAINE 2% (20 MG/ML) 5 ML SYRINGE
INTRAMUSCULAR | Status: AC
  Filled 2021-09-04: qty 5

## 2021-09-04 MED ORDER — PROMETHAZINE HCL 25 MG/ML IJ SOLN: 6.2500 mg | INTRAMUSCULAR | Status: DC | PRN

## 2021-09-04 MED ORDER — DEXAMETHASONE SODIUM PHOSPHATE 10 MG/ML IJ SOLN
INTRAMUSCULAR | Status: AC
  Filled 2021-09-04: qty 1

## 2021-09-04 MED ORDER — MIDAZOLAM HCL 2 MG/2ML IJ SOLN
2.0000 mg | Freq: Once | INTRAMUSCULAR | Status: AC
  Administered 2021-09-04: 2 mg via INTRAVENOUS

## 2021-09-04 MED ORDER — PROPOFOL 10 MG/ML IV BOLUS
INTRAVENOUS | Status: AC
  Filled 2021-09-04: qty 40

## 2021-09-04 MED ORDER — CEFAZOLIN SODIUM-DEXTROSE 2-4 GM/100ML-% IV SOLN
INTRAVENOUS | Status: AC
  Filled 2021-09-04: qty 100

## 2021-09-04 MED ORDER — ONDANSETRON HCL 4 MG/2ML IJ SOLN
INTRAMUSCULAR | Status: DC | PRN
  Administered 2021-09-04: 4 mg via INTRAVENOUS

## 2021-09-04 MED ORDER — ACETAMINOPHEN 500 MG PO TABS
ORAL_TABLET | ORAL | Status: AC
  Filled 2021-09-04: qty 2

## 2021-09-04 MED ORDER — OXYCODONE HCL 5 MG/5ML PO SOLN: 5.0000 mg | Freq: Once | ORAL | Status: DC | PRN

## 2021-09-04 MED ORDER — ONDANSETRON HCL 4 MG/2ML IJ SOLN
INTRAMUSCULAR | Status: AC
  Filled 2021-09-04: qty 2

## 2021-09-04 MED ORDER — FENTANYL CITRATE (PF) 100 MCG/2ML IJ SOLN
INTRAMUSCULAR | Status: DC | PRN
  Administered 2021-09-04 (×2): 25 ug via INTRAVENOUS
  Administered 2021-09-04: 50 ug via INTRAVENOUS

## 2021-09-04 MED ORDER — MIDAZOLAM HCL 2 MG/2ML IJ SOLN
INTRAMUSCULAR | Status: AC
  Filled 2021-09-04: qty 2

## 2021-09-04 MED ORDER — PROPOFOL 10 MG/ML IV BOLUS
INTRAVENOUS | Status: DC | PRN
  Administered 2021-09-04: 200 mg via INTRAVENOUS

## 2021-09-04 MED ORDER — AMISULPRIDE (ANTIEMETIC) 5 MG/2ML IV SOLN: 10.0000 mg | Freq: Once | INTRAVENOUS | Status: DC | PRN

## 2021-09-04 MED ORDER — DEXAMETHASONE SODIUM PHOSPHATE 4 MG/ML IJ SOLN
INTRAMUSCULAR | Status: DC | PRN
  Administered 2021-09-04: 10 mg via INTRAVENOUS

## 2021-09-04 MED ORDER — CEFAZOLIN SODIUM-DEXTROSE 2-4 GM/100ML-% IV SOLN
2.0000 g | INTRAVENOUS | Status: AC
  Administered 2021-09-04: 2 g via INTRAVENOUS

## 2021-09-04 MED ORDER — ACETAMINOPHEN 500 MG PO TABS
1000.0000 mg | ORAL_TABLET | Freq: Once | ORAL | Status: AC
  Administered 2021-09-04: 1000 mg via ORAL

## 2021-09-04 MED ORDER — LIDOCAINE 2% (20 MG/ML) 5 ML SYRINGE
INTRAMUSCULAR | Status: DC | PRN
  Administered 2021-09-04: 40 mg via INTRAVENOUS

## 2021-09-04 MED ORDER — FENTANYL CITRATE (PF) 100 MCG/2ML IJ SOLN: 25.0000 ug | INTRAMUSCULAR | Status: DC | PRN

## 2021-09-04 MED ORDER — FENTANYL CITRATE (PF) 100 MCG/2ML IJ SOLN
100.0000 ug | Freq: Once | INTRAMUSCULAR | Status: AC
  Administered 2021-09-04: 100 ug via INTRAVENOUS

## 2021-09-04 MED ORDER — LACTATED RINGERS IV SOLN: INTRAVENOUS | Status: DC

## 2021-09-04 MED ORDER — BUPIVACAINE-EPINEPHRINE (PF) 0.5% -1:200000 IJ SOLN
INTRAMUSCULAR | Status: DC | PRN
  Administered 2021-09-04 (×2): 20 mL via PERINEURAL

## 2021-09-04 MED ORDER — 0.9 % SODIUM CHLORIDE (POUR BTL) OPTIME
TOPICAL | Status: DC | PRN
  Administered 2021-09-04: 500 mL

## 2021-09-04 SURGICAL SUPPLY — 74 items
APL PRP STRL LF DISP 70% ISPRP (MISCELLANEOUS) ×2
BANDAGE ESMARK 6X9 LF (GAUZE/BANDAGES/DRESSINGS) ×1 IMPLANT
BIT DRILL 2.5X2.75 QC CALB (BIT) ×1 IMPLANT
BIT DRILL 3.5X5.5 QC CALB (BIT) ×1 IMPLANT
BLADE SURG 15 STRL LF DISP TIS (BLADE) ×2 IMPLANT
BLADE SURG 15 STRL SS (BLADE) ×4
BNDG CMPR 9X4 STRL LF SNTH (GAUZE/BANDAGES/DRESSINGS) ×1
BNDG CMPR 9X6 STRL LF SNTH (GAUZE/BANDAGES/DRESSINGS) ×1
BNDG COHESIVE 4X5 TAN NS LF (GAUZE/BANDAGES/DRESSINGS) ×2 IMPLANT
BNDG COHESIVE 4X5 TAN STRL (GAUZE/BANDAGES/DRESSINGS) ×2 IMPLANT
BNDG ELASTIC 4X5.8 VLCR STR LF (GAUZE/BANDAGES/DRESSINGS) ×2 IMPLANT
BNDG ELASTIC 6X5.8 VLCR STR LF (GAUZE/BANDAGES/DRESSINGS) ×2 IMPLANT
BNDG ESMARK 4X9 LF (GAUZE/BANDAGES/DRESSINGS) ×1 IMPLANT
BNDG ESMARK 6X9 LF (GAUZE/BANDAGES/DRESSINGS) ×2
BNDG GAUZE ELAST 4 BULKY (GAUZE/BANDAGES/DRESSINGS) ×2 IMPLANT
CHLORAPREP W/TINT 26 (MISCELLANEOUS) ×3 IMPLANT
COVER BACK TABLE 60X90IN (DRAPES) ×2 IMPLANT
COVER MAYO STAND STRL (DRAPES) ×2 IMPLANT
Cuff tourniquet dual port 34 ×1 IMPLANT
DRAPE EXTREMITY T 121X128X90 (DISPOSABLE) ×2 IMPLANT
DRAPE IMP U-DRAPE 54X76 (DRAPES) ×2 IMPLANT
DRAPE OEC MINIVIEW 54X84 (DRAPES) ×1 IMPLANT
DRAPE U-SHAPE 47X51 STRL (DRAPES) ×2 IMPLANT
DRSG PAD ABDOMINAL 8X10 ST (GAUZE/BANDAGES/DRESSINGS) ×2 IMPLANT
ELECT REM PT RETURN 9FT ADLT (ELECTROSURGICAL) ×2
ELECTRODE REM PT RTRN 9FT ADLT (ELECTROSURGICAL) ×1 IMPLANT
FIXATION ZIPTIGHT ANKLE SNDSMS (Ankle) IMPLANT
GAUZE 4X4 16PLY ~~LOC~~+RFID DBL (SPONGE) ×2 IMPLANT
GAUZE SPONGE 4X4 12PLY STRL (GAUZE/BANDAGES/DRESSINGS) ×2 IMPLANT
GAUZE XEROFORM 1X8 LF (GAUZE/BANDAGES/DRESSINGS) ×1 IMPLANT
GLOVE SRG 8 PF TXTR STRL LF DI (GLOVE) ×2 IMPLANT
GLOVE SURG ENC MOIS LTX SZ7.5 (GLOVE) ×4 IMPLANT
GLOVE SURG POLYISO LF SZ7.5 (GLOVE) ×1 IMPLANT
GLOVE SURG UNDER POLY LF SZ7 (GLOVE) ×3 IMPLANT
GLOVE SURG UNDER POLY LF SZ8 (GLOVE) ×4
GOWN STRL REUS W/TWL LRG LVL3 (GOWN DISPOSABLE) ×5 IMPLANT
GOWN STRL REUS W/TWL XL LVL3 (GOWN DISPOSABLE) ×1 IMPLANT
K-WIRE ACE 1.6X6 (WIRE) ×4
KIT TURNOVER CYSTO (KITS) ×2 IMPLANT
KWIRE ACE 1.6X6 (WIRE) IMPLANT
MANIFOLD NEPTUNE II (INSTRUMENTS) ×1 IMPLANT
NS IRRIG 500ML POUR BTL (IV SOLUTION) ×1 IMPLANT
PACK BASIN DAY SURGERY FS (CUSTOM PROCEDURE TRAY) ×2 IMPLANT
PAD CAST 4YDX4 CTTN HI CHSV (CAST SUPPLIES) ×1 IMPLANT
PADDING CAST ABS 4INX4YD NS (CAST SUPPLIES) ×2
PADDING CAST ABS COTTON 4X4 ST (CAST SUPPLIES) ×2 IMPLANT
PADDING CAST COTTON 4X4 STRL (CAST SUPPLIES) ×2
PADDING CAST COTTON 6X4 STRL (CAST SUPPLIES) ×2 IMPLANT
PENCIL SMOKE EVACUATOR (MISCELLANEOUS) ×2 IMPLANT
PLATE ACE 100DEG 7HOLE (Plate) ×1 IMPLANT
SCREW CORTICAL 3.5MM  12MM (Screw) ×2 IMPLANT
SCREW CORTICAL 3.5MM  28MM (Screw) ×2 IMPLANT
SCREW CORTICAL 3.5MM 12MM (Screw) IMPLANT
SCREW CORTICAL 3.5MM 14MM (Screw) ×2 IMPLANT
SCREW CORTICAL 3.5MM 28MM (Screw) IMPLANT
SCREW NLOCK CANC HEX 4X16 (Screw) ×2 IMPLANT
SPLINT FAST PLASTER 5X30 (CAST SUPPLIES) ×20
SPLINT PLASTER CAST FAST 5X30 (CAST SUPPLIES) ×20 IMPLANT
SPONGE T-LAP 4X18 ~~LOC~~+RFID (SPONGE) ×2 IMPLANT
STOCKINETTE 6  STRL (DRAPES) ×4
STOCKINETTE 6 STRL (DRAPES) ×1 IMPLANT
SUCTION FRAZIER HANDLE 10FR (MISCELLANEOUS) ×2
SUCTION TUBE FRAZIER 10FR DISP (MISCELLANEOUS) ×1 IMPLANT
SUT ETHILON 3 0 PS 1 (SUTURE) ×4 IMPLANT
SUT VIC AB 0 CT1 36 (SUTURE) ×2 IMPLANT
SUT VIC AB 2-0 SH 27 (SUTURE) ×2
SUT VIC AB 2-0 SH 27XBRD (SUTURE) ×1 IMPLANT
SUT VIC AB 3-0 SH 27 (SUTURE) ×4
SUT VIC AB 3-0 SH 27X BRD (SUTURE) IMPLANT
SYR BULB EAR ULCER 3OZ GRN STR (SYRINGE) ×2 IMPLANT
TOWEL OR 17X26 10 PK STRL BLUE (TOWEL DISPOSABLE) ×3 IMPLANT
TUBE CONNECTING 12X1/4 (SUCTIONS) ×2 IMPLANT
UNDERPAD 30X36 HEAVY ABSORB (UNDERPADS AND DIAPERS) ×2 IMPLANT
ZIPTIGHT ANKLE SYNODESMOSS FIX (Ankle) ×2 IMPLANT

## 2021-09-04 NOTE — Transfer of Care (Signed)
Immediate Anesthesia Transfer of Care Note  Patient: Michael Clayton.  Procedure(s) Performed: Procedure(s) (LRB): OPEN REDUCTION INTERNAL FIXATION (ORIF) ANKLE FRACTURE, SYNDESMOSIS FIXATION (Left)  Patient Location: PACU  Anesthesia Type: General  Level of Consciousness: awake, sedated, patient cooperative and responds to stimulation  Airway & Oxygen Therapy: Patient Spontanous Breathing and Patient connected to [place] 02 and soft FM   Post-op Assessment: Report given to PACU RN, Post -op Vital signs reviewed and stable and Patient moving all extremities  Post vital signs: Reviewed and stable  Complications: No apparent anesthesia complications

## 2021-09-04 NOTE — Anesthesia Procedure Notes (Signed)
Anesthesia Regional Block: Adductor canal block   Pre-Anesthetic Checklist: , timeout performed,  Correct Patient, Correct Site, Correct Laterality,  Correct Procedure,, site marked,  Risks and benefits discussed,  Surgical consent,  Pre-op evaluation,  At surgeon's request and post-op pain management  Laterality: Left  Prep: chloraprep       Needles:  Injection technique: Single-shot  Needle Type: Echogenic Stimulator Needle     Needle Length: 10cm  Needle Gauge: 21     Additional Needles:   Procedures:,,,, ultrasound used (permanent image in chart),,    Narrative:  Start time: 09/04/2021 10:15 AM End time: 09/04/2021 10:25 AM Injection made incrementally with aspirations every 5 mL.  Performed by: Personally  Anesthesiologist: Leonides Grills, MD  Additional Notes: Functioning IV was confirmed and monitors were applied. A time-out was performed. Hand hygiene and sterile gloves were used. The thigh was placed in a frog-leg position and prepped in a sterile fashion. A 21ga Bbraun echogenic stimulator needle was placed using ultrasound guidance.  Negative aspiration and negative test dose prior to incremental administration of local anesthetic. The patient tolerated the procedure well.
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
## 2021-09-04 NOTE — H&P (Signed)
H&P Update:  -History and Physical Reviewed  -Patient has been re-examined  -No change in the plan of care  -The risks and benefits were presented and reviewed. The risks due to hardware failure/irritation, infection, stiffness, nerve/vessel/tendon injury, nonunion/malunion, wound healing issues, development of arthritis, failure of this surgery, possibility of external fixation with delayed definitive surgery, need for further surgery, thromboembolic events, anesthesia/hospital related complications, amputation, death among others were discussed. The patient acknowledged the explanation, agreed to proceed with the plan and a consent was signed.  Debbra Digiulio  

## 2021-09-04 NOTE — Anesthesia Procedure Notes (Signed)
Procedure Name: LMA Insertion Date/Time: 09/04/2021 12:44 PM Performed by: Jessica Priest, CRNA Pre-anesthesia Checklist: Patient identified, Emergency Drugs available, Suction available, Patient being monitored and Timeout performed Patient Re-evaluated:Patient Re-evaluated prior to induction Oxygen Delivery Method: Circle system utilized Preoxygenation: Pre-oxygenation with 100% oxygen Induction Type: IV induction Ventilation: Mask ventilation without difficulty LMA: LMA inserted LMA Size: 4.0 Number of attempts: 1 Airway Equipment and Method: Bite block Placement Confirmation: positive ETCO2, breath sounds checked- equal and bilateral and CO2 detector Tube secured with: Tape Dental Injury: Teeth and Oropharynx as per pre-operative assessment

## 2021-09-04 NOTE — Anesthesia Preprocedure Evaluation (Addendum)
Anesthesia Evaluation  Patient identified by MRN, date of birth, ID band Patient awake    Reviewed: Allergy & Precautions, NPO status , Patient's Chart, lab work & pertinent test results  Airway Mallampati: II  TM Distance: >3 FB Neck ROM: Full    Dental no notable dental hx.    Pulmonary asthma , former smoker,    Pulmonary exam normal breath sounds clear to auscultation       Cardiovascular negative cardio ROS Normal cardiovascular exam Rhythm:Regular Rate:Normal     Neuro/Psych PSYCHIATRIC DISORDERS Anxiety negative neurological ROS     GI/Hepatic negative GI ROS, Neg liver ROS,   Endo/Other  negative endocrine ROS  Renal/GU negative Renal ROS     Musculoskeletal negative musculoskeletal ROS (+)   Abdominal   Peds  (+) ADHD Hematology negative hematology ROS (+)   Anesthesia Other Findings Closed bimalleolar fracture of left ankle  Reproductive/Obstetrics                            Anesthesia Physical Anesthesia Plan  ASA: 2  Anesthesia Plan: General and Regional   Post-op Pain Management: GA combined w/ Regional for post-op pain   Induction: Intravenous  PONV Risk Score and Plan: 2 and Ondansetron, Dexamethasone, Midazolam and Treatment may vary due to age or medical condition  Airway Management Planned: LMA  Additional Equipment:   Intra-op Plan:   Post-operative Plan: Extubation in OR  Informed Consent: I have reviewed the patients History and Physical, chart, labs and discussed the procedure including the risks, benefits and alternatives for the proposed anesthesia with the patient or authorized representative who has indicated his/her understanding and acceptance.     Dental advisory given  Plan Discussed with: CRNA  Anesthesia Plan Comments:         Anesthesia Quick Evaluation

## 2021-09-04 NOTE — Brief Op Note (Addendum)
09/04/2021  2:29 PM  PATIENT:  Michael Clayton.  30 y.o. male  PRE-OPERATIVE DIAGNOSIS:  Closed fracture of left ankle with posterior malleolus avulsion injury  POST-OPERATIVE DIAGNOSIS:  Closed fracture of left ankle with posterior malleolus avulsion injury  PROCEDURE:  Procedure(s): OPEN REDUCTION INTERNAL FIXATION (ORIF) ANKLE FRACTURE, SYNDESMOSIS FIXATION (Left)  SURGEON:  Surgeon(s) and Role:    * Netta Cedars, MD - Primary  PHYSICIAN ASSISTANT: None  ASSISTANTS: none   ANESTHESIA:   regional and general  EBL:  1 mL   BLOOD ADMINISTERED:none  DRAINS: none   LOCAL MEDICATIONS USED:  NONE  SPECIMEN:  No Specimen  DISPOSITION OF SPECIMEN:  N/A  COUNTS:  YES  TOURNIQUET:   Total Tourniquet Time Documented: Thigh (Left) - 61 minutes Total: Thigh (Left) - 61 minutes   DICTATION: .Note written in EPIC  PLAN OF CARE: Discharge to home after PACU  PATIENT DISPOSITION:  PACU - hemodynamically stable.   Delay start of Pharmacological VTE agent (>24hrs) due to surgical blood loss or risk of bleeding: no

## 2021-09-04 NOTE — Progress Notes (Signed)
AssistedDr. Ellender with left, ultrasound guided, popliteal, adductor canal block. Side rails up, monitors on throughout procedure. See vital signs in flow sheet. Tolerated Procedure well.  

## 2021-09-04 NOTE — Anesthesia Procedure Notes (Signed)
Anesthesia Regional Block: Popliteal block   Pre-Anesthetic Checklist: , timeout performed,  Correct Patient, Correct Site, Correct Laterality,  Correct Procedure,, site marked,  Risks and benefits discussed,  Surgical consent,  Pre-op evaluation,  At surgeon's request and post-op pain management  Laterality: Left  Prep: chloraprep       Needles:  Injection technique: Single-shot  Needle Type: Echogenic Stimulator Needle     Needle Length: 10cm  Needle Gauge: 21     Additional Needles:   Procedures:, nerve stimulator,,, ultrasound used (permanent image in chart),,     Nerve Stimulator or Paresthesia:  Response: Left foor dorsi and plantar felxion, 0.6 mA  Additional Responses:   Narrative:  Start time: 09/04/2021 10:05 AM End time: 09/04/2021 10:15 AM Injection made incrementally with aspirations every 5 mL.  Performed by: Personally  Anesthesiologist: Leonides Grills, MD  Additional Notes: Functioning IV was confirmed and monitors were applied.  A 21ga Bbraun echogenic stimulator needle was used. Sterile prep and drape,hand hygiene and sterile gloves were used.  Negative aspiration and negative test dose prior to incremental administration of local anesthetic. The patient tolerated the procedure well.
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
## 2021-09-04 NOTE — Anesthesia Postprocedure Evaluation (Signed)
Anesthesia Post Note  Patient: Michael Clayton.  Procedure(s) Performed: OPEN REDUCTION INTERNAL FIXATION (ORIF) ANKLE FRACTURE, SYNDESMOSIS FIXATION (Left: Ankle)     Patient location during evaluation: PACU Anesthesia Type: Regional and General Level of consciousness: awake Pain management: pain level controlled Vital Signs Assessment: post-procedure vital signs reviewed and stable Respiratory status: spontaneous breathing, nonlabored ventilation, respiratory function stable and patient connected to nasal cannula oxygen Cardiovascular status: blood pressure returned to baseline and stable Postop Assessment: no apparent nausea or vomiting Anesthetic complications: no   No notable events documented.  Last Vitals:  Vitals:   09/04/21 1450 09/04/21 1500  BP: 114/73   Pulse:  61  Resp:  14  Temp:    SpO2:  97%    Last Pain:  Vitals:   09/04/21 1450  TempSrc:   PainSc: 0-No pain                 Prather Failla P Tremaine Fuhriman

## 2021-09-05 ENCOUNTER — Encounter (HOSPITAL_BASED_OUTPATIENT_CLINIC_OR_DEPARTMENT_OTHER): Payer: Self-pay | Admitting: Orthopaedic Surgery

## 2021-09-05 ENCOUNTER — Telehealth: Payer: Self-pay | Admitting: Orthopaedic Surgery

## 2021-09-05 NOTE — Telephone Encounter (Signed)
Telephone Call Orthopaedic Surgery  -patient called office to discuss pain control following surgery -Michael Clayton is POD1 s/p left ankle and syndesmosis ORIF yesterday at [facility] -he has been taking oxycodone but doubling the dose to 20 mg every 4 hours. I reviewed dosing with him and urged him to take oxycodone 5-10 mg q4-6h as prescribed. He understands the dangerous risks of overdosing narcotics and will limit his oxycodone intake to recommended levels -he will take aspirin 325 mg once daily as well and we discussed the importance of this for blood clot prevention postop -he will also take tylenol and motrin to supplement his pain control -he will continue to elevate the operative limb to reduce swelling -I offered to see him in clinic tomorrow if symptoms persist but he feels things are manageable and he will follow up in 2 weeks as per original plan -I also recommended he visit ED at any time if he has acute pain control needs rather than self-medicating with higher doses of narcotics. He expressed understanding and will take only recommended narcotic dose  Netta Cedars, MD Orthopaedic Surgery [facility]

## 2021-09-24 ENCOUNTER — Telehealth (INDEPENDENT_AMBULATORY_CARE_PROVIDER_SITE_OTHER): Payer: Medicaid Other | Admitting: Nurse Practitioner

## 2021-09-24 ENCOUNTER — Other Ambulatory Visit: Payer: Self-pay

## 2021-09-24 ENCOUNTER — Encounter: Payer: Self-pay | Admitting: Nurse Practitioner

## 2021-09-24 DIAGNOSIS — L03012 Cellulitis of left finger: Secondary | ICD-10-CM

## 2021-09-24 MED ORDER — CEPHALEXIN 500 MG PO CAPS: 500.0000 mg | ORAL_CAPSULE | Freq: Three times a day (TID) | ORAL | 0 refills | Status: AC

## 2021-09-24 NOTE — Progress Notes (Signed)
Virtual Visit via Telephone Note  I connected with Michael Clayton. on 09/24/21 at  2:00 PM EST by telephone and verified that I am speaking with the correct person using two identifiers.  Location: Patient: home Provider: office   I discussed the limitations, risks, security and privacy concerns of performing an evaluation and management service by telephone and the availability of in person appointments. I also discussed with the patient that there may be a patient responsible charge related to this service. The patient expressed understanding and agreed to proceed.   History of Present Illness:  30 year old male with history of asthma, ADHD, anxiety.  Patient presents today for telephone visit to establish care.  Patient currently has albuterol and duo nebs as needed for asthma.  He is also taking Adderall for ADHD.  He is prescribed trazodone at night for sleep.  Patient states for the past few days he has been having issues with his middle finger on his left hand.  He states that the area around the nailbed is red and swollen and seems infected.  He denies any drainage. Denies f/c/s, n/v/d, hemoptysis, PND, chest pain or edema.     Observations/Objective:  Vitals with BMI 09/04/2021 09/04/2021 09/04/2021  Height - - -  Weight - - -  BMI - - -  Systolic - 114 110  Diastolic - 73 62  Pulse 61 - 63      Assessment and Plan:  Encounter to establish care Paronychia left third finger:  Warm water soaks 3 times daily  Will order keflex  Keep hands clean and dry  Follow up:  Will need follow up in 4 weeks to establish care with Dr. Andrey Campanile    I discussed the assessment and treatment plan with the patient. The patient was provided an opportunity to ask questions and all were answered. The patient agreed with the plan and demonstrated an understanding of the instructions.   The patient was advised to call back or seek an in-person evaluation if the symptoms worsen or if the  condition fails to improve as anticipated.  I provided 23 minutes of non-face-to-face time during this encounter.   Ivonne Andrew, NP

## 2021-09-24 NOTE — Patient Instructions (Signed)
Encounter to establish care Paronychia left third finger:  Warm water soaks 3 times daily  Will order keflex  Keep hands clean and dry  Follow up:  Will need follow up in 4 weeks to establish care with Dr. Andrey Campanile

## 2021-10-22 NOTE — Congregational Nurse Program (Signed)
CN met with client today for the first time.  Client here with his 32 y.o. daughter. He's using a walker as his right leg is in a cast since October. States that the cast is due to come off on next Monday. Client able to wiggle his toes and they are warm and pink at the bottom. Client reports having no other medical issues.  Client's daughter appears healthy and happy, loves her school, just getting in from school. Plan: Nurse will see client on next week.  Juliahna Wiswell D. Joneen Caraway MSN, [place] [employer] [phone]

## 2021-10-23 ENCOUNTER — Ambulatory Visit: Payer: Medicaid Other | Admitting: Family Medicine

## 2021-12-12 DIAGNOSIS — M25572 Pain in left ankle and joints of left foot: Secondary | ICD-10-CM | POA: Insufficient documentation

## 2021-12-12 HISTORY — DX: Pain in left ankle and joints of left foot: M25.572

## 2021-12-18 ENCOUNTER — Encounter (INDEPENDENT_AMBULATORY_CARE_PROVIDER_SITE_OTHER): Payer: Self-pay

## 2021-12-18 ENCOUNTER — Other Ambulatory Visit: Payer: Self-pay

## 2021-12-18 ENCOUNTER — Encounter: Payer: Self-pay | Admitting: Family Medicine

## 2021-12-18 ENCOUNTER — Ambulatory Visit (INDEPENDENT_AMBULATORY_CARE_PROVIDER_SITE_OTHER): Payer: Medicaid Other | Admitting: Family Medicine

## 2021-12-18 VITALS — BP 112/72 | HR 69 | Temp 98.1°F | Resp 16 | Wt 215.8 lb

## 2021-12-18 DIAGNOSIS — M545 Low back pain, unspecified: Secondary | ICD-10-CM

## 2021-12-18 MED ORDER — TRIAMCINOLONE ACETONIDE 40 MG/ML IJ SUSP
40.0000 mg | Freq: Once | INTRAMUSCULAR | Status: AC
  Administered 2021-12-18: 40 mg via INTRAMUSCULAR

## 2021-12-18 NOTE — Progress Notes (Signed)
Patient is her for STD checking. Patient was not expose he is just checking   Patient said he is having back issues from a broken leg in October 2022

## 2021-12-19 ENCOUNTER — Encounter: Payer: Self-pay | Admitting: Family Medicine

## 2021-12-19 NOTE — Progress Notes (Signed)
Established Patient Office Visit  Subjective:  Patient ID: Michael Aquilino., male    DOB: [birth year]  Age: 31 y.o. MRN: [id number]  CC:  Chief Complaint  Patient presents with   Establish Care   Exposure to STD    HPI Michael Imes. presents for complaint of chronic back pain exacerbation. He reports that he recently broke his left leg and had been lying around while it was healing. He has had progressive pain.   Past Medical History:  Diagnosis Date   ADHD (attention deficit hyperactivity disorder)    Ankle fracture, left    Anxiety    Asthma as a child   only using rescue inhaler.  Triggers are pollen allergies, cats, dogs, smoke   Blurry vision, right eye    in center of right eye last 2 weeks per pt on 09-03-2021    Past Surgical History:  Procedure Laterality Date   EYE SURGERY Bilateral 2014   Detached retina on left with prophylactic laser surgery on the right as apparently at risk.   ORIF ANKLE FRACTURE Left 09/04/2021   Procedure: OPEN REDUCTION INTERNAL FIXATION (ORIF) ANKLE FRACTURE, SYNDESMOSIS FIXATION;  Surgeon: Netta Cedars, MD;  Location: [facility] [place];  Service: Orthopedics;  Laterality: Left;    Family History  Problem Relation Age of Onset   Peptic Ulcer Mother        History of bleeding ulcer   Sickle cell trait Brother     Social History   Socioeconomic History   Marital status: Single    Spouse name: Not on file   Number of children: 1   Years of education: 12 + CC   Highest education level: Not on file  Occupational History   Occupation: room service at [employer]  Tobacco Use   Smoking status: Former    Packs/day: 0.20    Years: 1.00    Pack years: 0.20    Types: Cigarettes    Start date: 05/23/2007    Quit date: 05/22/2008    Years since quitting: 13.5   Smokeless tobacco: Never  Vaping Use   Vaping Use: Never used  Substance and Sexual Activity   Alcohol use: Not Currently    Alcohol/week: 3.0 standard  drinks    Types: 3 Standard drinks or equivalent per week   Drug use: No   Sexual activity: Not on file  Other Topics Concern   Not on file  Social History Narrative   Born and raised in [place] to [place].   Currently living with mother and trying to work hard to get custody of his infant daughter.   Social Determinants of Health   Financial Resource Strain: Not on file  Food Insecurity: Not on file  Transportation Needs: Not on file  Physical Activity: Not on file  Stress: Not on file  Social Connections: Not on file  Intimate Partner Violence: Not on file    ROS Review of Systems  Musculoskeletal:  Positive for back pain. Negative for gait problem.  Neurological:  Negative for weakness.  All other systems reviewed and are negative.  Objective:   Today's Vitals: BP 112/72    Pulse 69    Temp 98.1 F (36.7 C) (Oral)    Resp 16    Wt 215 lb 12.8 oz (97.9 kg)    SpO2 95%    BMI 29.27 kg/m   Physical Exam Vitals and nursing note reviewed.  Constitutional:      General:  He is not in acute distress. Cardiovascular:     Rate and Rhythm: Normal rate and regular rhythm.  Pulmonary:     Effort: Pulmonary effort is normal.     Breath sounds: Normal breath sounds.  Abdominal:     Palpations: Abdomen is soft.     Tenderness: There is no abdominal tenderness.  Musculoskeletal:     Lumbar back: Spasms and tenderness present. No swelling or edema. Decreased range of motion.  Neurological:     General: No focal deficit present.     Mental Status: He is alert and oriented to person, place, and time.    Assessment & Plan:   1. Midline low back pain without sciatica, unspecified chronicity Kenalog IM injection given. Recommend exercises and activity as tolerated. Tylenol/nsaids prn. monitor - triamcinolone acetonide (KENALOG-40) injection 40 mg    Outpatient Encounter Medications as of 12/18/2021  Medication Sig   acetaminophen (TYLENOL) 500 MG tablet Take 1,000  mg by mouth every 8 (eight) hours as needed for moderate pain.   albuterol (PROVENTIL HFA;VENTOLIN HFA) 108 (90 Base) MCG/ACT inhaler Inhale 2 puffs into the lungs every 6 (six) hours as needed for wheezing or shortness of breath.   amphetamine-dextroamphetamine (ADDERALL) 30 MG tablet Take 30 mg by mouth daily.   busPIRone (BUSPAR) 10 MG tablet Take 10 mg by mouth daily.   ipratropium-albuterol (DUONEB) 0.5-2.5 (3) MG/3ML SOLN Take 3 mLs by nebulization every 6 (six) hours as needed. Use 3 times daily x 3 days then every 6 hours as needed.   traZODone (DESYREL) 100 MG tablet Take 100 mg by mouth at bedtime.   [DISCONTINUED] ibuprofen (ADVIL) 200 MG tablet Take 400 mg by mouth every 6 (six) hours as needed for moderate pain. (Patient not taking: Reported on 12/18/2021)   [DISCONTINUED] oxyCODONE (OXY IR/ROXICODONE) 5 MG immediate release tablet Take 5 mg by mouth every 4 (four) hours as needed for severe pain. (Patient not taking: Reported on 09/03/2021)   [EXPIRED] triamcinolone acetonide (KENALOG-40) injection 40 mg    No facility-administered encounter medications on file as of 12/18/2021.    Follow-up: Return in about 1 week (around 12/25/2021) for physical.   Tommie Raymond, MD

## 2021-12-25 ENCOUNTER — Ambulatory Visit (INDEPENDENT_AMBULATORY_CARE_PROVIDER_SITE_OTHER): Payer: Medicaid Other

## 2021-12-25 ENCOUNTER — Ambulatory Visit
Admission: EM | Admit: 2021-12-25 | Discharge: 2021-12-25 | Discharge status: Against Medical Advice | Payer: Medicaid Other

## 2021-12-25 ENCOUNTER — Other Ambulatory Visit: Payer: Self-pay

## 2021-12-25 ENCOUNTER — Encounter: Payer: Medicaid Other | Admitting: Family Medicine

## 2021-12-25 ENCOUNTER — Ambulatory Visit
Admission: EM | Admit: 2021-12-25 | Discharge: 2021-12-25 | Disposition: A | Discharge status: Home / Self Care | Payer: Medicaid Other | Attending: Physician Assistant | Admitting: Physician Assistant

## 2021-12-25 ENCOUNTER — Encounter (INDEPENDENT_AMBULATORY_CARE_PROVIDER_SITE_OTHER): Payer: Self-pay

## 2021-12-25 DIAGNOSIS — M79662 Pain in left lower leg: Secondary | ICD-10-CM

## 2021-12-25 DIAGNOSIS — M79672 Pain in left foot: Secondary | ICD-10-CM

## 2021-12-25 MED ORDER — PREDNISONE 20 MG PO TABS: 40.0000 mg | ORAL_TABLET | Freq: Every day | ORAL | 0 refills | Status: AC

## 2021-12-25 NOTE — ED Provider Notes (Incomplete)
[facility]    CSN: [id number] Arrival date & time: 12/25/21  1118      History   Chief Complaint No chief complaint on file.   HPI Michael Clayton. is a 31 y.o. male.   HPI  Past Medical History:  Diagnosis Date   ADHD (attention deficit hyperactivity disorder)    Ankle fracture, left    Anxiety    Asthma as a child   only using rescue inhaler.  Triggers are pollen allergies, cats, dogs, smoke   Blurry vision, right eye    in center of right eye last 2 weeks per pt on 09-03-2021    Patient Active Problem List   Diagnosis Date Noted   Moderate persistent asthma with exacerbation 04/20/2017   Environmental and seasonal allergies 04/20/2017   Flexural eczema 04/20/2017   Acute asthma exacerbation 02/17/2017    Past Surgical History:  Procedure Laterality Date   EYE SURGERY Bilateral 2014   Detached retina on left with prophylactic laser surgery on the right as apparently at risk.   ORIF ANKLE FRACTURE Left 09/04/2021   Procedure: OPEN REDUCTION INTERNAL FIXATION (ORIF) ANKLE FRACTURE, SYNDESMOSIS FIXATION;  Surgeon: Netta Cedars, MD;  Location: [facility] [place];  Service: Orthopedics;  Laterality: Left;       Home Medications    Prior to Admission medications   Medication Sig Start Date End Date Taking? Authorizing Provider  acetaminophen (TYLENOL) 500 MG tablet Take 1,000 mg by mouth every 8 (eight) hours as needed for moderate pain.    [provider]  albuterol (PROVENTIL HFA;VENTOLIN HFA) 108 (90 Base) MCG/ACT inhaler Inhale 2 puffs into the lungs every 6 (six) hours as needed for wheezing or shortness of breath. 08/12/17   Julieanne Manson, MD  amphetamine-dextroamphetamine (ADDERALL) 30 MG tablet Take 30 mg by mouth daily.    [provider]  busPIRone (BUSPAR) 10 MG tablet Take 10 mg by mouth daily. 10/10/21   [provider]  ipratropium-albuterol (DUONEB) 0.5-2.5 (3) MG/3ML SOLN Take 3  mLs by nebulization every 6 (six) hours as needed. Use 3 times daily x 3 days then every 6 hours as needed. 02/18/17   Rodolph Bong, MD  traZODone (DESYREL) 100 MG tablet Take 100 mg by mouth at bedtime.    [provider]    Family History Family History  Problem Relation Age of Onset   Peptic Ulcer Mother        History of bleeding ulcer   Sickle cell trait Brother     Social History Social History   Tobacco Use   Smoking status: Former    Packs/day: 0.20    Years: 1.00    Pack years: 0.20    Types: Cigarettes    Start date: 05/23/2007    Quit date: 05/22/2008    Years since quitting: 13.6   Smokeless tobacco: Never  Vaping Use   Vaping Use: Never used  Substance Use Topics   Alcohol use: Not Currently    Alcohol/week: 3.0 standard drinks    Types: 3 Standard drinks or equivalent per week   Drug use: No     Allergies   Patient has no allergy information on record.   Review of Systems Review of Systems  Constitutional:  Negative for chills and fever.  Eyes:  Negative for discharge and redness.  Musculoskeletal:  Positive for arthralgias.  Neurological:  Negative for numbness.    Physical Exam Triage Vital Signs ED Triage Vitals  Enc Vitals  Group     BP      Pulse      Resp      Temp      Temp src      SpO2      Weight      Height      Head Circumference      Peak Flow      Pain Score      Pain Loc      Pain Edu?      Excl. in GC?    No data found.  Updated Vital Signs There were no vitals taken for this visit.  Visual Acuity Right Eye Distance:   Left Eye Distance:   Bilateral Distance:    Right Eye Near:   Left Eye Near:    Bilateral Near:     Physical Exam Vitals and nursing note reviewed.  Constitutional:      General: He is not in acute distress.    Appearance: Normal appearance. He is not ill-appearing.  HENT:     Head: Normocephalic and atraumatic.  Eyes:     Conjunctiva/sclera: Conjunctivae normal.   Cardiovascular:     Rate and Rhythm: Normal rate.  Pulmonary:     Effort: Pulmonary effort is normal.  Neurological:     Mental Status: He is alert.  Psychiatric:        Mood and Affect: Mood normal.        Behavior: Behavior normal.        Thought Content: Thought content normal.     UC Treatments / Results  Labs (all labs ordered are listed, but only abnormal results are displayed) Labs Reviewed - No data to display  EKG   Radiology No results found.  Procedures Procedures (including critical care time)  Medications Ordered in UC Medications - No data to display  Initial Impression / Assessment and Plan / UC Course  I have reviewed the triage vital signs and the nursing notes.  Pertinent labs & imaging results that were available during my care of the patient were reviewed by me and considered in my medical decision making (see chart for details).     *** Final Clinical Impressions(s) / UC Diagnoses   Final diagnoses:  None   Discharge Instructions   None    ED Prescriptions   None    PDMP not reviewed this encounter.

## 2021-12-25 NOTE — ED Provider Notes (Signed)
[facility]    CSN: [id number] Arrival date & time: 12/25/21  1118      History   Chief Complaint Chief Complaint  Patient presents with   Ankle Pain    HPI Michael Clayton. is a 31 y.o. male.   Patient here today for evaluation of pain and swelling in his left foot and ankle. He had surgery to same about 3-4 months ago. He has follow up with surgeon in a week but is not sure he can tolerate pain that long. Weight bearing worsens pain.   The history is provided by the patient.  Ankle Pain Associated symptoms: no fever    Past Medical History:  Diagnosis Date   ADHD (attention deficit hyperactivity disorder)    Ankle fracture, left    Anxiety    Asthma as a child   only using rescue inhaler.  Triggers are pollen allergies, cats, dogs, smoke   Blurry vision, right eye    in center of right eye last 2 weeks per pt on 09-03-2021    Patient Active Problem List   Diagnosis Date Noted   Moderate persistent asthma with exacerbation 04/20/2017   Environmental and seasonal allergies 04/20/2017   Flexural eczema 04/20/2017   Acute asthma exacerbation 02/17/2017    Past Surgical History:  Procedure Laterality Date   EYE SURGERY Bilateral 2014   Detached retina on left with prophylactic laser surgery on the right as apparently at risk.   ORIF ANKLE FRACTURE Left 09/04/2021   Procedure: OPEN REDUCTION INTERNAL FIXATION (ORIF) ANKLE FRACTURE, SYNDESMOSIS FIXATION;  Surgeon: Netta Cedars, MD;  Location: [facility];  Service: Orthopedics;  Laterality: Left;       Home Medications    Prior to Admission medications   Medication Sig Start Date End Date Taking? Authorizing Provider  predniSONE (DELTASONE) 20 MG tablet Take 2 tablets (40 mg total) by mouth daily with breakfast for 5 days. 12/25/21 12/30/21 Yes Tomi Bamberger, PA-C  acetaminophen (TYLENOL) 500 MG tablet Take 1,000 mg by mouth every 8 (eight) hours as needed for moderate pain.     [provider]  albuterol (PROVENTIL HFA;VENTOLIN HFA) 108 (90 Base) MCG/ACT inhaler Inhale 2 puffs into the lungs every 6 (six) hours as needed for wheezing or shortness of breath. 08/12/17   Julieanne Manson, MD  amphetamine-dextroamphetamine (ADDERALL) 30 MG tablet Take 30 mg by mouth daily.    [provider]  busPIRone (BUSPAR) 10 MG tablet Take 10 mg by mouth daily. 10/10/21   [provider]  ipratropium-albuterol (DUONEB) 0.5-2.5 (3) MG/3ML SOLN Take 3 mLs by nebulization every 6 (six) hours as needed. Use 3 times daily x 3 days then every 6 hours as needed. 02/18/17   Rodolph Bong, MD  traZODone (DESYREL) 100 MG tablet Take 100 mg by mouth at bedtime.    [provider]    Family History Family History  Problem Relation Age of Onset   Peptic Ulcer Mother        History of bleeding ulcer   Sickle cell trait Brother     Social History Social History   Tobacco Use   Smoking status: Former    Packs/day: 0.20    Years: 1.00    Pack years: 0.20    Types: Cigarettes    Start date: 05/23/2007    Quit date: 05/22/2008    Years since quitting: 13.6   Smokeless tobacco: Never  Vaping Use   Vaping Use: Never used  Substance Use Topics   Alcohol use: Not Currently    Alcohol/week: 3.0 standard drinks    Types: 3 Standard drinks or equivalent per week   Drug use: No     Allergies   Patient has no allergy information on record.   Review of Systems Review of Systems  Constitutional:  Negative for chills and fever.  Eyes:  Negative for discharge and redness.  Musculoskeletal:  Positive for arthralgias and joint swelling.  Skin:  Negative for color change and wound.  Neurological:  Negative for numbness.    Physical Exam Triage Vital Signs ED Triage Vitals  Enc Vitals Group     BP 12/25/21 1134 115/76     Pulse Rate 12/25/21 1134 69     Resp 12/25/21 1134 18     Temp 12/25/21 1134 98.2 F (36.8 C)     Temp Source 12/25/21  1134 Oral     SpO2 12/25/21 1134 97 %     Weight --      Height --      Head Circumference --      Peak Flow --      Pain Score 12/25/21 1136 0     Pain Loc --      Pain Edu? --      Excl. in GC? --    No data found.  Updated Vital Signs BP 115/76 (BP Location: Left Arm)    Pulse 69    Temp 98.2 F (36.8 C) (Oral)    Resp 18    SpO2 97%      Physical Exam Vitals and nursing note reviewed.  Constitutional:      General: He is not in acute distress.    Appearance: Normal appearance. He is not ill-appearing.  HENT:     Head: Normocephalic and atraumatic.  Eyes:     Conjunctiva/sclera: Conjunctivae normal.  Cardiovascular:     Rate and Rhythm: Normal rate.  Pulmonary:     Effort: Pulmonary effort is normal.  Musculoskeletal:     Comments: Diffuse swelling to left ankle, upper foot  Neurological:     Mental Status: He is alert.  Psychiatric:        Mood and Affect: Mood normal.        Behavior: Behavior normal.        Thought Content: Thought content normal.     UC Treatments / Results  Labs (all labs ordered are listed, but only abnormal results are displayed) Labs Reviewed - No data to display  EKG   Radiology DG Tibia/Fibula Left  Result Date: 12/25/2021 CLINICAL DATA:  Left lower leg pain, swelling, and numbness. History of left ankle fracture in October of 2022 with subsequent ORIF EXAM: LEFT FOOT - COMPLETE 3+ VIEW; LEFT ANKLE COMPLETE - 3+ VIEW; LEFT TIBIA AND FIBULA - 2 VIEW COMPARISON:  08/29/2021 FINDINGS: Interval ORIF of distal left fibular fracture with lateral sideplate and screw fixation construct as well as trans syndesmotic tight rope. Fracture line appears nearly healed. Ossification within the distal tibiofibular syndesmosis. Alignment at the ankle mortise is maintained. No new fracture. No dislocation. Pes planus alignment. There is decreased bony mineralization, likely related to disuse. Mild soft tissue swelling at the ankle, most pronounced  anteriorly. IMPRESSION: 1. Healing distal left fibular fracture status post ORIF. 2. No acute osseous abnormality of the left lower leg, ankle, or foot. 3. Osseous demineralization, likely secondary to disuse. Electronically Signed   By: Duanne Guess D.O.   On:  12/25/2021 12:25   DG Ankle Complete Left  Result Date: 12/25/2021 CLINICAL DATA:  Left lower leg pain, swelling, and numbness. History of left ankle fracture in October of 2022 with subsequent ORIF EXAM: LEFT FOOT - COMPLETE 3+ VIEW; LEFT ANKLE COMPLETE - 3+ VIEW; LEFT TIBIA AND FIBULA - 2 VIEW COMPARISON:  08/29/2021 FINDINGS: Interval ORIF of distal left fibular fracture with lateral sideplate and screw fixation construct as well as trans syndesmotic tight rope. Fracture line appears nearly healed. Ossification within the distal tibiofibular syndesmosis. Alignment at the ankle mortise is maintained. No new fracture. No dislocation. Pes planus alignment. There is decreased bony mineralization, likely related to disuse. Mild soft tissue swelling at the ankle, most pronounced anteriorly. IMPRESSION: 1. Healing distal left fibular fracture status post ORIF. 2. No acute osseous abnormality of the left lower leg, ankle, or foot. 3. Osseous demineralization, likely secondary to disuse. Electronically Signed   By: Duanne Guess D.O.   On: 12/25/2021 12:25   DG Foot Complete Left  Result Date: 12/25/2021 CLINICAL DATA:  Left lower leg pain, swelling, and numbness. History of left ankle fracture in October of 2022 with subsequent ORIF EXAM: LEFT FOOT - COMPLETE 3+ VIEW; LEFT ANKLE COMPLETE - 3+ VIEW; LEFT TIBIA AND FIBULA - 2 VIEW COMPARISON:  08/29/2021 FINDINGS: Interval ORIF of distal left fibular fracture with lateral sideplate and screw fixation construct as well as trans syndesmotic tight rope. Fracture line appears nearly healed. Ossification within the distal tibiofibular syndesmosis. Alignment at the ankle mortise is maintained. No new  fracture. No dislocation. Pes planus alignment. There is decreased bony mineralization, likely related to disuse. Mild soft tissue swelling at the ankle, most pronounced anteriorly. IMPRESSION: 1. Healing distal left fibular fracture status post ORIF. 2. No acute osseous abnormality of the left lower leg, ankle, or foot. 3. Osseous demineralization, likely secondary to disuse. Electronically Signed   By: Duanne Guess D.O.   On: 12/25/2021 12:25    Procedures Procedures (including critical care time)  Medications Ordered in UC Medications - No data to display  Initial Impression / Assessment and Plan / UC Course  I have reviewed the triage vital signs and the nursing notes.  Pertinent labs & imaging results that were available during my care of the patient were reviewed by me and considered in my medical decision making (see chart for details).    Xrays with healing fracture-- patient has appointment with his surgeon within the next week- encouraged him to keep this follow up. Will treat with steroid burst in the meantime. Encouraged follow up sooner with any concerns.   Final Clinical Impressions(s) / UC Diagnoses   Final diagnoses:  Left foot pain   Discharge Instructions   None    ED Prescriptions     Medication Sig Dispense Auth. Provider   predniSONE (DELTASONE) 20 MG tablet Take 2 tablets (40 mg total) by mouth daily with breakfast for 5 days. 10 tablet Tomi Bamberger, PA-C      PDMP not reviewed this encounter.   Tomi Bamberger, PA-C 12/25/21 1308

## 2021-12-25 NOTE — ED Triage Notes (Signed)
Pt c/o sharp left foot pain s/p surgery a few months ago. States feels numbness and tingling down into toes. Foot appears edematous. States surgery involved 2 rods and a plate for broken leg. States has followup in about 1 week but unable to tolerate discomfort until then.

## 2022-02-06 ENCOUNTER — Encounter: Payer: Medicaid Other | Admitting: Family Medicine

## 2022-08-12 ENCOUNTER — Ambulatory Visit
Admission: EM | Admit: 2022-08-12 | Discharge: 2022-08-12 | Disposition: A | Discharge status: Home / Self Care | Payer: Medicaid Other | Attending: Physician Assistant | Admitting: Physician Assistant

## 2022-08-12 DIAGNOSIS — J020 Streptococcal pharyngitis: Secondary | ICD-10-CM | POA: Insufficient documentation

## 2022-08-12 DIAGNOSIS — N4889 Other specified disorders of penis: Secondary | ICD-10-CM | POA: Insufficient documentation

## 2022-08-12 LAB — POCT RAPID STREP A (OFFICE): Rapid Strep A Screen: POSITIVE — AB

## 2022-08-12 MED ORDER — AMOXICILLIN 500 MG PO CAPS: 500.0000 mg | ORAL_CAPSULE | Freq: Three times a day (TID) | ORAL | 0 refills | Status: AC

## 2022-08-12 MED ORDER — AMOXICILLIN 500 MG PO CAPS: 500.0000 mg | ORAL_CAPSULE | Freq: Three times a day (TID) | ORAL | 0 refills | Status: DC

## 2022-08-12 NOTE — ED Triage Notes (Signed)
Pt presents to uc with co of sore throat for one week and penial itchiness starting 3 days.

## 2022-08-12 NOTE — ED Provider Notes (Signed)
[facility]    CSN: [id number] Arrival date & time: 08/12/22  1806      History   Chief Complaint Sore throat  HPI Michael Clayton. is a 31 y.o. male.   Patient here today for evaluation of sore throat that started about a week ago.  He reports that sore throat has continued.  He also noted that 3 days ago he started to have some abnormal sensation of penis.  He reports it feels somewhat like an itch but not really an inch.  He denies any genital lesions or rashes.  He has not had any penile discharge.  He denies fever.  He denies any abdominal pain, nausea or vomiting.  The history is provided by the patient.  Sore Throat Pertinent negatives include no abdominal pain and no shortness of breath.    Past Medical History:  Diagnosis Date   ADHD (attention deficit hyperactivity disorder)    Ankle fracture, left    Anxiety    Asthma as a child   only using rescue inhaler.  Triggers are pollen allergies, cats, dogs, smoke   Blurry vision, right eye    in center of right eye last 2 weeks per pt on 09-03-2021    Patient Active Problem List   Diagnosis Date Noted   Moderate persistent asthma with exacerbation 04/20/2017   Environmental and seasonal allergies 04/20/2017   Flexural eczema 04/20/2017   Acute asthma exacerbation 02/17/2017    Past Surgical History:  Procedure Laterality Date   EYE SURGERY Bilateral 2014   Detached retina on left with prophylactic laser surgery on the right as apparently at risk.   ORIF ANKLE FRACTURE Left 09/04/2021   Procedure: OPEN REDUCTION INTERNAL FIXATION (ORIF) ANKLE FRACTURE, SYNDESMOSIS FIXATION;  Surgeon: Netta Cedars, MD;  Location: [facility] ;  Service: Orthopedics;  Laterality: Left;       Home Medications    Prior to Admission medications   Medication Sig Start Date End Date Taking? Authorizing Provider  amoxicillin (AMOXIL) 500 MG capsule Take 1 capsule (500 mg total) by mouth 3 (three)  times daily. 08/12/22  Yes Tomi Bamberger, PA-C  acetaminophen (TYLENOL) 500 MG tablet Take 1,000 mg by mouth every 8 (eight) hours as needed for moderate pain.    [provider]  albuterol (PROVENTIL HFA;VENTOLIN HFA) 108 (90 Base) MCG/ACT inhaler Inhale 2 puffs into the lungs every 6 (six) hours as needed for wheezing or shortness of breath. 08/12/17   Julieanne Manson, MD  amphetamine-dextroamphetamine (ADDERALL) 30 MG tablet Take 30 mg by mouth daily.    [provider]  busPIRone (BUSPAR) 10 MG tablet Take 10 mg by mouth daily. 10/10/21   [provider]  ipratropium-albuterol (DUONEB) 0.5-2.5 (3) MG/3ML SOLN Take 3 mLs by nebulization every 6 (six) hours as needed. Use 3 times daily x 3 days then every 6 hours as needed. 02/18/17   Rodolph Bong, MD  traZODone (DESYREL) 100 MG tablet Take 100 mg by mouth at bedtime.    [provider]    Family History Family History  Problem Relation Age of Onset   Peptic Ulcer Mother        History of bleeding ulcer   Sickle cell trait Brother     Social History Social History   Tobacco Use   Smoking status: Former    Packs/day: 0.20    Years: 1.00    Total pack years: 0.20    Types: Cigarettes  Start date: 05/23/2007    Quit date: 05/22/2008    Years since quitting: 14.2   Smokeless tobacco: Never  Vaping Use   Vaping Use: Never used  Substance Use Topics   Alcohol use: Not Currently    Alcohol/week: 3.0 standard drinks of alcohol    Types: 3 Standard drinks or equivalent per week   Drug use: No     Allergies   Patient has no known allergies.   Review of Systems Review of Systems  Constitutional:  Negative for chills and fever.  HENT:  Positive for sore throat. Negative for congestion.   Eyes:  Negative for discharge and redness.  Respiratory:  Negative for cough and shortness of breath.   Gastrointestinal:  Negative for abdominal pain, nausea and vomiting.  Genitourinary:   Negative for genital sores and penile discharge.  Neurological:  Negative for numbness.     Physical Exam Triage Vital Signs ED Triage Vitals  Enc Vitals Group     BP 08/12/22 1924 (!) 144/87     Pulse Rate 08/12/22 1924 76     Resp 08/12/22 1924 20     Temp 08/12/22 1924 98 F (36.7 C)     Temp src --      SpO2 08/12/22 1924 98 %     Weight --      Height --      Head Circumference --      Peak Flow --      Pain Score 08/12/22 1923 0     Pain Loc --      Pain Edu? --      Excl. in [place]? --    No data found.  Updated Vital Signs BP (!) 144/87   Pulse 76   Temp 98 F (36.7 C)   Resp 20   SpO2 98%   Physical Exam Vitals and nursing note reviewed.  Constitutional:      General: He is not in acute distress.    Appearance: Normal appearance. He is not ill-appearing.  HENT:     Head: Normocephalic and atraumatic.     Mouth/Throat:     Mouth: Mucous membranes are moist.     Pharynx: Oropharynx is clear. Posterior oropharyngeal erythema present. No oropharyngeal exudate.  Eyes:     Conjunctiva/sclera: Conjunctivae normal.  Cardiovascular:     Rate and Rhythm: Normal rate.  Pulmonary:     Effort: Pulmonary effort is normal.  Neurological:     Mental Status: He is alert.  Psychiatric:        Mood and Affect: Mood normal.        Behavior: Behavior normal.        Thought Content: Thought content normal.      UC Treatments / Results  Labs (all labs ordered are listed, but only abnormal results are displayed) Labs Reviewed  POCT RAPID STREP A (OFFICE) - Abnormal; Notable for the following components:      Result Value   Rapid Strep A Screen Positive (*)    All other components within normal limits  CYTOLOGY, (ORAL, ANAL, URETHRAL) ANCILLARY ONLY    EKG   Radiology No results found.  Procedures Procedures (including critical care time)  Medications Ordered in UC Medications - No data to display  Initial Impression / Assessment and Plan / UC Course   I have reviewed the triage vital signs and the nursing notes.  Pertinent labs & imaging results that were available during my care of the patient were  reviewed by me and considered in my medical decision making (see chart for details).    Strep test positive in office.  Will treat with amoxicillin and recommend follow-up with any further concerns.  Cytology swab also ordered to rule out STD as cause of penile symptoms.  Final Clinical Impressions(s) / UC Diagnoses   Final diagnoses:  Strep pharyngitis  Penile irritation   Discharge Instructions   None    ED Prescriptions     Medication Sig Dispense Auth. Provider   amoxicillin (AMOXIL) 500 MG capsule Take 1 capsule (500 mg total) by mouth 3 (three) times daily. 21 capsule Tomi Bamberger, PA-C      PDMP not reviewed this encounter.   Tomi Bamberger, PA-C 08/12/22 1942

## 2022-08-14 LAB — CYTOLOGY, (ORAL, ANAL, URETHRAL) ANCILLARY ONLY
Chlamydia: NEGATIVE
Comment: NEGATIVE
Comment: NEGATIVE
Comment: NORMAL
Neisseria Gonorrhea: NEGATIVE
Trichomonas: NEGATIVE

## 2022-08-21 ENCOUNTER — Encounter: Payer: Self-pay | Admitting: Emergency Medicine

## 2022-08-21 ENCOUNTER — Other Ambulatory Visit: Payer: Self-pay

## 2022-08-21 ENCOUNTER — Ambulatory Visit
Admission: EM | Admit: 2022-08-21 | Discharge: 2022-08-21 | Disposition: A | Discharge status: Home / Self Care | Payer: Medicaid Other | Attending: Physician Assistant | Admitting: Physician Assistant

## 2022-08-21 DIAGNOSIS — J45901 Unspecified asthma with (acute) exacerbation: Secondary | ICD-10-CM

## 2022-08-21 MED ORDER — ALBUTEROL SULFATE HFA 108 (90 BASE) MCG/ACT IN AERS: 2.0000 | INHALATION_SPRAY | Freq: Four times a day (QID) | RESPIRATORY_TRACT | 0 refills | Status: DC | PRN

## 2022-08-21 MED ORDER — PREDNISONE 20 MG PO TABS: 40.0000 mg | ORAL_TABLET | Freq: Every day | ORAL | 0 refills | Status: AC

## 2022-08-21 NOTE — ED Triage Notes (Signed)
Pt here for asthma sx with hx of same; pt sts out of inhaler and feels like he may need prednisone

## 2022-08-21 NOTE — ED Provider Notes (Signed)
[facility]    CSN: [id number] Arrival date & time: 08/21/22  1709      History   Chief Complaint Chief Complaint  Patient presents with   Asthma    HPI Michael Clayton. is a 31 y.o. male.   Patient here today for evaluation of possible asthma exacerbation.  He reports lately he has had more shallow breathing and wheezing.  He has needed prednisone prescribed in the past and thinks he may need this again.  He also needs refill of albuterol inhaler.  He has not been taking any over-the-counter medications.  He denies any fever, sore throat, nausea or vomiting.  He has not had any diarrhea.  The history is provided by the patient.  Asthma Associated symptoms include shortness of breath.    Past Medical History:  Diagnosis Date   ADHD (attention deficit hyperactivity disorder)    Ankle fracture, left    Anxiety    Asthma as a child   only using rescue inhaler.  Triggers are pollen allergies, cats, dogs, smoke   Blurry vision, right eye    in center of right eye last 2 weeks per pt on 09-03-2021    Patient Active Problem List   Diagnosis Date Noted   Moderate persistent asthma with exacerbation 04/20/2017   Environmental and seasonal allergies 04/20/2017   Flexural eczema 04/20/2017   Acute asthma exacerbation 02/17/2017    Past Surgical History:  Procedure Laterality Date   EYE SURGERY Bilateral 2014   Detached retina on left with prophylactic laser surgery on the right as apparently at risk.   ORIF ANKLE FRACTURE Left 09/04/2021   Procedure: OPEN REDUCTION INTERNAL FIXATION (ORIF) ANKLE FRACTURE, SYNDESMOSIS FIXATION;  Surgeon: Armond Hang, MD;  Location: [place];  Service: Orthopedics;  Laterality: Left;       Home Medications    Prior to Admission medications   Medication Sig Start Date End Date Taking? Authorizing Provider  predniSONE (DELTASONE) 20 MG tablet Take 2 tablets (40 mg total) by mouth daily with breakfast  for 5 days. 08/21/22 08/26/22 Yes Francene Finders, PA-C  acetaminophen (TYLENOL) 500 MG tablet Take 1,000 mg by mouth every 8 (eight) hours as needed for moderate pain.    [provider]  albuterol (VENTOLIN HFA) 108 (90 Base) MCG/ACT inhaler Inhale 2 puffs into the lungs every 6 (six) hours as needed for wheezing or shortness of breath. 08/21/22   Francene Finders, PA-C  amphetamine-dextroamphetamine (ADDERALL) 30 MG tablet Take 30 mg by mouth daily.    [provider]  busPIRone (BUSPAR) 10 MG tablet Take 10 mg by mouth daily. 10/10/21   [provider]  ipratropium-albuterol (DUONEB) 0.5-2.5 (3) MG/3ML SOLN Take 3 mLs by nebulization every 6 (six) hours as needed. Use 3 times daily x 3 days then every 6 hours as needed. 02/18/17   Eugenie Filler, MD  traZODone (DESYREL) 100 MG tablet Take 100 mg by mouth at bedtime.    [provider]    Family History Family History  Problem Relation Age of Onset   Peptic Ulcer Mother        History of bleeding ulcer   Sickle cell trait Brother     Social History Social History   Tobacco Use   Smoking status: Former    Packs/day: 0.20    Years: 1.00    Total pack years: 0.20    Types: Cigarettes    Start date: 05/23/2007  Quit date: 05/22/2008    Years since quitting: 14.2   Smokeless tobacco: Never  Vaping Use   Vaping Use: Never used  Substance Use Topics   Alcohol use: Not Currently    Alcohol/week: 3.0 standard drinks of alcohol    Types: 3 Standard drinks or equivalent per week   Drug use: No     Allergies   Patient has no known allergies.   Review of Systems Review of Systems  Constitutional:  Negative for chills and fever.  HENT:  Negative for congestion and sore throat.   Eyes:  Negative for discharge and redness.  Respiratory:  Positive for shortness of breath and wheezing. Negative for cough.   Gastrointestinal:  Negative for diarrhea, nausea and vomiting.  Neurological:   Negative for numbness.     Physical Exam Triage Vital Signs ED Triage Vitals [08/21/22 1751]  Enc Vitals Group     BP 130/71     Pulse Rate 75     Resp 18     Temp 97.9 F (36.6 C)     Temp Source Oral     SpO2 97 %     Weight      Height      Head Circumference      Peak Flow      Pain Score 0     Pain Loc      Pain Edu?      Excl. in GC?    No data found.  Updated Vital Signs BP 130/71 (BP Location: Left Arm)   Pulse 75   Temp 97.9 F (36.6 C) (Oral)   Resp 18   SpO2 97%       Physical Exam Vitals and nursing note reviewed.  Constitutional:      General: He is not in acute distress.    Appearance: Normal appearance. He is not ill-appearing.  HENT:     Head: Normocephalic and atraumatic.  Eyes:     Conjunctiva/sclera: Conjunctivae normal.  Cardiovascular:     Rate and Rhythm: Normal rate and regular rhythm.  Pulmonary:     Effort: Pulmonary effort is normal. No respiratory distress.     Breath sounds: Normal breath sounds. No wheezing, rhonchi or rales.  Neurological:     Mental Status: He is alert.  Psychiatric:        Mood and Affect: Mood normal.        Behavior: Behavior normal.        Thought Content: Thought content normal.      UC Treatments / Results  Labs (all labs ordered are listed, but only abnormal results are displayed) Labs Reviewed - No data to display  EKG   Radiology No results found.  Procedures Procedures (including critical care time)  Medications Ordered in UC Medications - No data to display  Initial Impression / Assessment and Plan / UC Course  I have reviewed the triage vital signs and the nursing notes.  Pertinent labs & imaging results that were available during my care of the patient were reviewed by me and considered in my medical decision making (see chart for details).   Albuterol inhaler refilled as requested and steroid burst prescribed to cover asthma exacerbation.  Recommended follow-up if no gradual  improvement or with any further concerns.   Final Clinical Impressions(s) / UC Diagnoses   Final diagnoses:  Asthma with acute exacerbation, unspecified asthma severity, unspecified whether persistent   Discharge Instructions   None    ED Prescriptions  Medication Sig Dispense Auth. Provider   albuterol (VENTOLIN HFA) 108 (90 Base) MCG/ACT inhaler Inhale 2 puffs into the lungs every 6 (six) hours as needed for wheezing or shortness of breath. 1 each Tomi Bamberger, PA-C   predniSONE (DELTASONE) 20 MG tablet Take 2 tablets (40 mg total) by mouth daily with breakfast for 5 days. 10 tablet Tomi Bamberger, PA-C      PDMP not reviewed this encounter.   Tomi Bamberger, PA-C 08/21/22 1827

## 2022-09-13 ENCOUNTER — Encounter (HOSPITAL_BASED_OUTPATIENT_CLINIC_OR_DEPARTMENT_OTHER): Payer: Self-pay | Admitting: Emergency Medicine

## 2022-09-13 ENCOUNTER — Emergency Department (HOSPITAL_BASED_OUTPATIENT_CLINIC_OR_DEPARTMENT_OTHER)
Admission: EM | Admit: 2022-09-13 | Discharge: 2022-09-13 | Disposition: A | Discharge status: Home / Self Care | Payer: Medicaid Other | Attending: Emergency Medicine | Admitting: Emergency Medicine

## 2022-09-13 ENCOUNTER — Other Ambulatory Visit (HOSPITAL_BASED_OUTPATIENT_CLINIC_OR_DEPARTMENT_OTHER): Payer: Self-pay

## 2022-09-13 ENCOUNTER — Other Ambulatory Visit: Payer: Self-pay

## 2022-09-13 ENCOUNTER — Emergency Department (HOSPITAL_BASED_OUTPATIENT_CLINIC_OR_DEPARTMENT_OTHER): Payer: Medicaid Other | Admitting: Radiology

## 2022-09-13 DIAGNOSIS — Z20822 Contact with and (suspected) exposure to covid-19: Secondary | ICD-10-CM | POA: Diagnosis not present

## 2022-09-13 DIAGNOSIS — Z7951 Long term (current) use of inhaled steroids: Secondary | ICD-10-CM | POA: Diagnosis not present

## 2022-09-13 DIAGNOSIS — R0602 Shortness of breath: Secondary | ICD-10-CM | POA: Diagnosis present

## 2022-09-13 DIAGNOSIS — J45909 Unspecified asthma, uncomplicated: Secondary | ICD-10-CM | POA: Insufficient documentation

## 2022-09-13 LAB — RESP PANEL BY RT-PCR (FLU A&B, COVID) ARPGX2
Influenza A by PCR: NEGATIVE
Influenza B by PCR: NEGATIVE
SARS Coronavirus 2 by RT PCR: NEGATIVE

## 2022-09-13 MED ORDER — ALBUTEROL SULFATE HFA 108 (90 BASE) MCG/ACT IN AERS
INHALATION_SPRAY | RESPIRATORY_TRACT | Status: AC
  Filled 2022-09-13: qty 6.7

## 2022-09-13 MED ORDER — ALBUTEROL SULFATE HFA 108 (90 BASE) MCG/ACT IN AERS
2.0000 | INHALATION_SPRAY | Freq: Four times a day (QID) | RESPIRATORY_TRACT | 1 refills | Status: DC | PRN
  Filled 2022-09-13: qty 18, 25d supply, fill #0

## 2022-09-13 MED ORDER — ALBUTEROL SULFATE HFA 108 (90 BASE) MCG/ACT IN AERS
2.0000 | INHALATION_SPRAY | Freq: Once | RESPIRATORY_TRACT | Status: DC
Start: 2022-09-13 — End: 2022-09-13

## 2022-09-13 MED ORDER — METHYLPREDNISOLONE SODIUM SUCC 125 MG IJ SOLR
125.0000 mg | Freq: Once | INTRAMUSCULAR | Status: AC
  Administered 2022-09-13: 125 mg via INTRAVENOUS
  Filled 2022-09-13: qty 2

## 2022-09-13 MED ORDER — ALBUTEROL (5 MG/ML) CONTINUOUS INHALATION SOLN: 10.0000 mg/h | INHALATION_SOLUTION | Freq: Once | RESPIRATORY_TRACT | Status: DC

## 2022-09-13 MED ORDER — ALBUTEROL SULFATE HFA 108 (90 BASE) MCG/ACT IN AERS: 2.0000 | INHALATION_SPRAY | RESPIRATORY_TRACT | Status: DC | PRN

## 2022-09-13 MED ORDER — PREDNISONE 10 MG PO TABS
40.0000 mg | ORAL_TABLET | Freq: Every day | ORAL | 0 refills | Status: AC
  Filled 2022-09-13: qty 20, 5d supply, fill #0

## 2022-09-13 NOTE — ED Triage Notes (Signed)
Pt via pov from home with asthma exacerbation x 1 week. Pt reports that he has been using inhaler and nebulizer with little relief; states he is breathing shallowly and his chest feels tight and wheezy. Pt alert & oriented, nad noted.

## 2022-09-13 NOTE — Discharge Instructions (Addendum)
Evaluation of your shortness of breath revealed that it is likely related to her asthma.  Reassured that you feel much better after your steroids and continuous albuterol.  Recommend that you follow-up with your PCP regarding your ongoing asthma.  That you take a 5-day course of prednisone which is an oral steroid to help with inflammation related to asthma.  This will likely help your breathing as well.  If your shortness of breath gets worse, new chest pain, calf tenderness please return to the emergency department for further evaluation.

## 2022-09-13 NOTE — ED Provider Notes (Signed)
[facility] EMERGENCY DEPT Provider Note   CSN: [id number] Arrival date & time: 09/13/22  1129     History  Chief Complaint  Patient presents with   Asthma   HPI Michael Clayton. is a 31 y.o. male with h/o asthma presenting for shortness of breath. Shortness of breath started about a week ago. Patient states that he has had to use his nebulizer and rescue inhaler more than usual.  Denies chest pain. Shortness of breath is constant. Unsure what makes it better or worse.  States he can be sitting on his couch or walking up the steps and becomes short of breath with wheezing. Denies cough and fever. Denies lower extremity swelling.  Patient also stated that he has not establish care with PCP and notes that he should do so given his ongoing asthma.    Asthma Associated symptoms include shortness of breath.       Home Medications Prior to Admission medications   Medication Sig Start Date End Date Taking? Authorizing Provider  predniSONE (DELTASONE) 10 MG tablet Take 4 tablets (40 mg total) by mouth daily for 5 days. 09/13/22 09/18/22 Yes Gareth Eagle, PA-C  acetaminophen (TYLENOL) 500 MG tablet Take 1,000 mg by mouth every 8 (eight) hours as needed for moderate pain.    [provider]  albuterol (VENTOLIN HFA) 108 (90 Base) MCG/ACT inhaler Inhale 2 puffs into the lungs every 6 (six) hours as needed for wheezing or shortness of breath. 08/21/22   Tomi Bamberger, PA-C  amphetamine-dextroamphetamine (ADDERALL) 30 MG tablet Take 30 mg by mouth daily.    [provider]  busPIRone (BUSPAR) 10 MG tablet Take 10 mg by mouth daily. 10/10/21   [provider]  ipratropium-albuterol (DUONEB) 0.5-2.5 (3) MG/3ML SOLN Take 3 mLs by nebulization every 6 (six) hours as needed. Use 3 times daily x 3 days then every 6 hours as needed. 02/18/17   Rodolph Bong, MD  traZODone (DESYREL) 100 MG tablet Take 100 mg by mouth at bedtime.    [provider]      Allergies    Patient has no known allergies.    Review of Systems   Review of Systems  Respiratory:  Positive for shortness of breath.     Physical Exam Updated Vital Signs BP 122/81   Pulse 60   Temp 98.3 F (36.8 C) (Oral)   Resp 20   Ht 6' (1.829 m)   Wt 97.5 kg   SpO2 99%   BMI 29.16 kg/m  Physical Exam Vitals and nursing note reviewed.  HENT:     Head: Normocephalic and atraumatic.     Mouth/Throat:     Mouth: Mucous membranes are moist.  Eyes:     General:        Right eye: No discharge.        Left eye: No discharge.     Conjunctiva/sclera: Conjunctivae normal.  Cardiovascular:     Rate and Rhythm: Normal rate and regular rhythm.     Pulses: Normal pulses.     Heart sounds: Normal heart sounds.  Pulmonary:     Effort: Pulmonary effort is normal.     Breath sounds: Examination of the right-lower field reveals wheezing. Examination of the left-lower field reveals wheezing. Wheezing present. No decreased breath sounds, rhonchi or rales.  Abdominal:     General: Abdomen is flat.     Palpations: Abdomen is soft.  Skin:    General: Skin is warm  and dry.  Neurological:     General: No focal deficit present.  Psychiatric:        Mood and Affect: Mood normal.     ED Results / Procedures / Treatments   Labs (all labs ordered are listed, but only abnormal results are displayed) Labs Reviewed  RESP PANEL BY RT-PCR (FLU A&B, COVID) [id number]    EKG None  Radiology DG Chest 2 View  Result Date: 09/13/2022 CLINICAL DATA:  Shortness of breath.  Asthma exacerbation. EXAM: CHEST - 2 VIEW COMPARISON:  02/17/2017 FINDINGS: Normal heart size and mediastinal contours. No signs of pleural effusion or edema. Central airway thickening identified. No airspace consolidation. Visualized osseous structures are unremarkable. IMPRESSION: Central airway thickening compatible with asthma. No airspace consolidation. Electronically Signed   By: Signa Kell  M.D.   On: 09/13/2022 13:11    Procedures Procedures    Medications Ordered in ED Medications  albuterol (PROVENTIL,VENTOLIN) solution continuous neb (has no administration in time range)  methylPREDNISolone sodium succinate (SOLU-MEDROL) 125 mg/2 mL injection 125 mg (125 mg Intravenous Given 09/13/22 1317)    ED Course/ Medical Decision Making/ A&P                           Medical Decision Making Amount and/or Complexity of Data Reviewed Radiology: ordered.  Risk Prescription drug management.   This patient presents to the ED for concern of shortness of breath, this involves a number of treatment options, and is a complaint that carries with it a high risk of complications and morbidity.  The differential diagnosis includes asthma exacerbation, ACS, CHF, and pneumonia.   Co morbidities: Discussed in HPI   EMR reviewed including pt PMHx, past surgical history and past visits to ER.   See HPI for more details   Lab Tests:   No labs ordered   Imaging Studies:  Abnormal findings. I personally reviewed all imaging studies. Imaging notable for central airway thickening consistent with asthma    Cardiac Monitoring:  The patient was maintained on a cardiac monitor.  I personally viewed and interpreted the cardiac monitored which showed an underlying rhythm of: NSR NA   Medicines ordered:  I ordered medication including Solu-Medrol and continuous albuterol for asthma exacerbation. Reevaluation of the patient after these medicines showed that the patient improved I have reviewed the patients home medicines and have made adjustments as needed      Consults/Attending Physician   I discussed this case with my attending physician who cosigned this note including patient's presenting symptoms, physical exam, and planned diagnostics and interventions. Attending physician stated agreement with plan or made changes to plan which were  implemented.   Reevaluation:  After the interventions noted above I re-evaluated patient and found that they have :improved    Problem List / ED Course: Patient presented for shortness of breath.  Patient has known history of asthma.  On exam, wheezing appreciated in the lower bases of the lungs.  Doubt ACS given no chest pain, age and no risk factors.  Also doubt pneumonia given no cough and reassuring x-ray.  Doubt CHF given no lower extremity edema and x-ray negative for pulmonary edema and effusions.  Symptoms consistent with asthma exacerbation.  Treated with IV Solu-Medrol and continuous albuterol.  Upon reevaluation patient stated that he did feel much better.  Also discussed establishing care with PCP in the area.  Provided contact information to [facility] in [place].  Discussed return  precautions.  Discharged home with 5-day course of prednisone.  Dispostion:  After consideration of the diagnostic results and the patients response to treatment, I feel that the patent would benefit from discharge and follow-up with PCP for asthma.  Also start 5-day course of oral prednisone.         Final Clinical Impression(s) / ED Diagnoses Final diagnoses:  Moderate asthma, unspecified whether complicated, unspecified whether persistent    Rx / DC Orders ED Discharge Orders          Ordered    predniSONE (DELTASONE) 10 MG tablet  Daily        09/13/22 1418              Gareth Eagle, PA-C 09/13/22 1451    Derwood Kaplan, MD 09/14/22 1355

## 2022-12-13 ENCOUNTER — Ambulatory Visit
Admission: EM | Admit: 2022-12-13 | Discharge: 2022-12-13 | Disposition: A | Discharge status: Home / Self Care | Payer: Medicaid Other | Attending: Nurse Practitioner | Admitting: Nurse Practitioner

## 2022-12-13 DIAGNOSIS — S199XXA Unspecified injury of neck, initial encounter: Secondary | ICD-10-CM

## 2022-12-13 MED ORDER — AMOXICILLIN-POT CLAVULANATE 875-125 MG PO TABS: 1.0000 | ORAL_TABLET | Freq: Two times a day (BID) | ORAL | 0 refills | Status: DC
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
## 2022-12-13 NOTE — Discharge Instructions (Signed)
Start Augmentin twice daily for 7 days Please follow-up with your PCP if symptoms or not improving Please go to the emergency room for any worsening symptoms
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
## 2022-12-16 ENCOUNTER — Ambulatory Visit
Admission: EM | Admit: 2022-12-16 | Discharge: 2022-12-16 | Disposition: A | Discharge status: Home / Self Care | Payer: Medicaid Other | Attending: Urgent Care | Admitting: Urgent Care

## 2022-12-16 DIAGNOSIS — T280XXA Burn of mouth and pharynx, initial encounter: Secondary | ICD-10-CM | POA: Diagnosis not present

## 2022-12-16 DIAGNOSIS — J029 Acute pharyngitis, unspecified: Secondary | ICD-10-CM

## 2022-12-16 DIAGNOSIS — J069 Acute upper respiratory infection, unspecified: Secondary | ICD-10-CM | POA: Diagnosis not present

## 2022-12-16 DIAGNOSIS — R070 Pain in throat: Secondary | ICD-10-CM

## 2022-12-16 MED ORDER — NAPROXEN 500 MG PO TABS: 500.0000 mg | ORAL_TABLET | Freq: Two times a day (BID) | ORAL | 0 refills | Status: DC

## 2022-12-16 MED ORDER — LIDOCAINE VISCOUS HCL 2 % MT SOLN: 15.0000 mL | OROMUCOSAL | 0 refills | Status: DC | PRN
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
## 2022-12-16 NOTE — ED Triage Notes (Signed)
Pt c/o right earache x 5 days-NAD-steady gait

## 2023-01-20 ENCOUNTER — Other Ambulatory Visit (HOSPITAL_BASED_OUTPATIENT_CLINIC_OR_DEPARTMENT_OTHER): Payer: Self-pay

## 2023-01-20 ENCOUNTER — Encounter (HOSPITAL_BASED_OUTPATIENT_CLINIC_OR_DEPARTMENT_OTHER): Payer: Self-pay

## 2023-01-20 ENCOUNTER — Emergency Department (HOSPITAL_BASED_OUTPATIENT_CLINIC_OR_DEPARTMENT_OTHER)
Admission: EM | Admit: 2023-01-20 | Discharge: 2023-01-20 | Disposition: A | Discharge status: Home / Self Care | Payer: Medicaid Other | Attending: Emergency Medicine | Admitting: Emergency Medicine

## 2023-01-20 ENCOUNTER — Emergency Department (HOSPITAL_BASED_OUTPATIENT_CLINIC_OR_DEPARTMENT_OTHER): Payer: Medicaid Other

## 2023-01-20 ENCOUNTER — Other Ambulatory Visit: Payer: Self-pay

## 2023-01-20 DIAGNOSIS — Z87891 Personal history of nicotine dependence: Secondary | ICD-10-CM | POA: Diagnosis not present

## 2023-01-20 DIAGNOSIS — R0602 Shortness of breath: Secondary | ICD-10-CM | POA: Diagnosis present

## 2023-01-20 DIAGNOSIS — J45901 Unspecified asthma with (acute) exacerbation: Secondary | ICD-10-CM | POA: Diagnosis not present

## 2023-01-20 MED ORDER — AEROCHAMBER PLUS FLO-VU LARGE MISC
1.0000 | Freq: Once | Status: AC
  Administered 2023-01-20: 1
  Filled 2023-01-20: qty 1

## 2023-01-20 MED ORDER — ALBUTEROL SULFATE (2.5 MG/3ML) 0.083% IN NEBU
2.5000 mg | INHALATION_SOLUTION | Freq: Four times a day (QID) | RESPIRATORY_TRACT | 12 refills | Status: DC | PRN
  Filled 2023-01-20: qty 75, 7d supply, fill #0

## 2023-01-20 MED ORDER — IPRATROPIUM-ALBUTEROL 0.5-2.5 (3) MG/3ML IN SOLN
3.0000 mL | Freq: Once | RESPIRATORY_TRACT | Status: AC
  Administered 2023-01-20: 3 mL via RESPIRATORY_TRACT
  Filled 2023-01-20: qty 3

## 2023-01-20 MED ORDER — ALBUTEROL SULFATE HFA 108 (90 BASE) MCG/ACT IN AERS
1.0000 | INHALATION_SPRAY | Freq: Four times a day (QID) | RESPIRATORY_TRACT | Status: DC | PRN
  Administered 2023-01-20: 2 via RESPIRATORY_TRACT
  Filled 2023-01-20: qty 6.7

## 2023-01-20 MED ORDER — ALBUTEROL SULFATE HFA 108 (90 BASE) MCG/ACT IN AERS
1.0000 | INHALATION_SPRAY | Freq: Four times a day (QID) | RESPIRATORY_TRACT | 0 refills | Status: DC | PRN
  Filled 2023-01-20: qty 18, 25d supply, fill #0

## 2023-01-20 MED ORDER — PREDNISONE 20 MG PO TABS
40.0000 mg | ORAL_TABLET | Freq: Every day | ORAL | 0 refills | Status: DC
  Filled 2023-01-20: qty 10, 5d supply, fill #0

## 2023-01-20 MED ORDER — PREDNISONE 50 MG PO TABS
60.0000 mg | ORAL_TABLET | Freq: Once | ORAL | Status: AC
  Administered 2023-01-20: 60 mg via ORAL
  Filled 2023-01-20: qty 1

## 2023-01-20 MED ORDER — ALBUTEROL SULFATE (2.5 MG/3ML) 0.083% IN NEBU: 2.5000 mg | INHALATION_SOLUTION | Freq: Once | RESPIRATORY_TRACT | Status: DC

## 2023-01-20 NOTE — ED Notes (Signed)
Patient given a spacer to use with his inhaler. Patient stated he knew how tom use and was educated.

## 2023-01-20 NOTE — ED Triage Notes (Signed)
Patient here POV from Home.  Endorses SOB related Asthma for 2 Weeks.   Has been using Albuterol Inhaler with some relief but requests a refill along with some steroids.    Some Dry Cough. No fever.   NAD Noted during Triage. A&Ox4. Gcs 15. Ambulatory.

## 2023-01-20 NOTE — ED Notes (Signed)
Reviewed AVS/discharge instruction with patient. Time allotted for and all questions answered. Patient is agreeable for d/c and escorted to ed exit by staff.  

## 2023-01-20 NOTE — Discharge Instructions (Signed)
The workup today was overall reassuring.  Medicine sent into the pharmacy.  Take prednisone for the next few days as discussed.  Recommend follow-up with primary care provider for further management of your asthma to avoid systemic steroids is much as possible.  Please do not hesitate to return to emergency department for worrisome signs and symptoms we discussed become apparent.

## 2023-01-20 NOTE — ED Provider Notes (Signed)
[facility] Provider Note   CSN: [id number] Arrival date & time: 01/20/23  1601     History  Chief Complaint  Patient presents with   Shortness of Breath    Michael Clayton. is a 32 y.o. male.   Shortness of Breath   32 year old male presents emergency department with complaints of shortness of breath.  Patient states that he has recently been out of his albuterol nebulizer at home.  States has been using his albuterol inhaler at home more frequently which has helped some.  States that symptoms have gradually worsened over the past 2 weeks.  States that this feels similar to prior asthma exacerbations and has noted significant improvement with oral prednisone and refill of albuterol inhaler.  Patient does not have established primary care provider but is actively seeking 1.  Denies fever, chills, cough, congestion, chest pain, abdominal pain, nausea, vomiting, urinary symptoms, change in bowel habits.  Past medical history significant for ADHD, asthma, anxiety  Home Medications Prior to Admission medications   Medication Sig Start Date End Date Taking? Authorizing Provider  albuterol (PROVENTIL) (2.5 MG/3ML) 0.083% nebulizer solution Inhae 3 mLs (2.5 mg total) by nebulization every 6 (six) hours as needed for wheezing or shortness of breath. 01/20/23  Yes Dion Saucier A, PA  albuterol (VENTOLIN HFA) 108 (90 Base) MCG/ACT inhaler Inhale 1-2 puffs into the lungs every 6 (six) hours as needed for wheezing or shortness of breath. 01/20/23  Yes Wilnette Kales, PA  predniSONE (DELTASONE) 20 MG tablet Take 2 tablets (40 mg total) by mouth daily with breakfast. 01/21/23  Yes Dion Saucier A, PA  acetaminophen (TYLENOL) 500 MG tablet Take 1,000 mg by mouth every 8 (eight) hours as needed for moderate pain.    [provider]  amoxicillin-clavulanate (AUGMENTIN) 875-125 MG tablet Take 1 tablet by mouth every 12 (twelve) hours. 12/13/22    Melynda Ripple, NP  amphetamine-dextroamphetamine (ADDERALL) 30 MG tablet Take 30 mg by mouth daily.    [provider]  busPIRone (BUSPAR) 10 MG tablet Take 10 mg by mouth daily. 10/10/21   [provider]  ipratropium-albuterol (DUONEB) 0.5-2.5 (3) MG/3ML SOLN Take 3 mLs by nebulization every 6 (six) hours as needed. Use 3 times daily x 3 days then every 6 hours as needed. 02/18/17   Eugenie Filler, MD  lidocaine (XYLOCAINE) 2 % solution Use as directed 15 mLs in the mouth or throat as needed for mouth pain. 12/16/22   Jaynee Eagles, PA-C  naproxen (NAPROSYN) 500 MG tablet Take 1 tablet (500 mg total) by mouth 2 (two) times daily with a meal. 12/16/22   Jaynee Eagles, PA-C  traZODone (DESYREL) 100 MG tablet Take 100 mg by mouth at bedtime.    [provider]      Allergies    Patient has no known allergies.    Review of Systems   Review of Systems  Respiratory:  Positive for shortness of breath.   All other systems reviewed and are negative.   Physical Exam Updated Vital Signs BP 123/79 (BP Location: Right Arm)   Pulse 73   Temp 97.8 F (36.6 C) (Oral)   Resp 16   Ht 6' (1.829 m)   Wt 85.7 kg   SpO2 100%   BMI 25.63 kg/m  Physical Exam Vitals and nursing note reviewed.  Constitutional:      General: He is not in acute distress.    Appearance: He is well-developed.  HENT:     Head: Normocephalic and atraumatic.  Eyes:     Conjunctiva/sclera: Conjunctivae normal.  Cardiovascular:     Rate and Rhythm: Normal rate and regular rhythm.     Heart sounds: No murmur heard. Pulmonary:     Effort: Pulmonary effort is normal. No tachypnea, accessory muscle usage or respiratory distress.     Breath sounds: No rales.     Comments: Mild wheeze auscultated diffusely bilateral lung fields. Abdominal:     Palpations: Abdomen is soft.     Tenderness: There is no abdominal tenderness.  Musculoskeletal:        General: No swelling.     Cervical back: Neck supple.      Right lower leg: No edema.     Left lower leg: No edema.  Skin:    General: Skin is warm and dry.     Capillary Refill: Capillary refill takes less than 2 seconds.  Neurological:     Mental Status: He is alert.  Psychiatric:        Mood and Affect: Mood normal.     ED Results / Procedures / Treatments   Labs (all labs ordered are listed, but only abnormal results are displayed) Labs Reviewed - No data to display  EKG EKG Interpretation  Date/Time:  Tuesday January 20 2023 16:29:22 EDT Ventricular Rate:  66 PR Interval:  165 QRS Duration: 93 QT Interval:  415 QTC Calculation: 435 R Axis:   85 Text Interpretation: Sinus rhythm ST elev, probable normal early repol pattern No significant change since prior 4/18 Confirmed by Aletta Edouard [phone]) on 01/20/2023 4:39:31 PM  Radiology DG Chest Port 1 View  Result Date: 01/20/2023 CLINICAL DATA:  Shortness of breath. EXAM: PORTABLE CHEST 1 VIEW COMPARISON:  September 13, 2022 FINDINGS: The heart size and mediastinal contours are within normal limits. Both lungs are clear. The visualized skeletal structures are unremarkable. IMPRESSION: No active disease. Electronically Signed   By: Virgina Norfolk M.D.   On: 01/20/2023 17:08    Procedures Procedures    Medications Ordered in ED Medications  albuterol (VENTOLIN HFA) 108 (90 Base) MCG/ACT inhaler 1-2 puff (2 puffs Inhalation Given 01/20/23 1656)  ipratropium-albuterol (DUONEB) 0.5-2.5 (3) MG/3ML nebulizer solution 3 mL (3 mLs Nebulization Given 01/20/23 1621)  predniSONE (DELTASONE) tablet 60 mg (60 mg Oral Given 01/20/23 1645)  AeroChamber Plus Flo-Vu Large MISC 1 each (1 each Other Given 01/20/23 1657)    ED Course/ Medical Decision Making/ A&P                             Medical Decision Making Amount and/or Complexity of Data Reviewed Radiology: ordered.  Risk Prescription drug management.   This patient presents to the ED for concern of shortness of breath, this  involves an extensive number of treatment options, and is a complaint that carries with it a high risk of complications and morbidity.  The differential diagnosis includes The causes for shortness of breath include but are not limited to Cardiac (AHF, pericardial effusion and tamponade, arrhythmias, ischemia, etc) Respiratory (COPD, asthma, pneumonia, pneumothorax, primary pulmonary hypertension, PE/VQ mismatch) Hematological (anemia)  Co morbidities that complicate the patient evaluation  See HPI   Additional history obtained:  Additional history obtained from EMR External records from outside source obtained and reviewed including hospital records   Lab Tests:  N/a   Imaging Studies ordered:  I ordered imaging studies including chest x-ray I independently  visualized and interpreted imaging which showed no acute cardiopulmonary abnormality I agree with the radiologist interpretation   Cardiac Monitoring: / EKG:  The patient was maintained on a cardiac monitor.  I personally viewed and interpreted the cardiac monitored which showed an underlying rhythm of: Sinus rhythm without visible T wave inversion in V1 with ST repolarization pattern appreciated   Consultations Obtained:  N/a   Problem List / ED Course / Critical interventions / Medication management  Asthma exacerbation I ordered medication including prednisone, DuoNeb   Reevaluation of the patient after these medicines showed that the patient improved I have reviewed the patients home medicines and have made adjustments as needed   Social Determinants of Health:  Former cigarette use.  Denies illicit drug use.   Test / Admission - Considered:  Asthma exacerbation Vitals signs  within normal range and stable throughout visit. Imaging studies significant for: See above Patient overall well-appearing, afebrile in no acute respiratory distress.  Symptoms most consistent with asthma exacerbation given physical  exam findings and reported similarity of symptoms from prior episodes.  Will refill albuterol inhaler as well as nebulized therapy give short course of oral prednisone for the next few days.  Strongly recommend follow-up with primary care provider for reassessment of patient's asthma for further symptomatic control.  Treatment plan discussed at length with patient and he acknowledged understanding was agreeable to said plan. Worrisome signs and symptoms were discussed with the patient, and the patient acknowledged understanding to return to the ED if noticed. Patient was stable upon discharge.          Final Clinical Impression(s) / ED Diagnoses Final diagnoses:  Exacerbation of asthma, unspecified asthma severity, unspecified whether persistent    Rx / DC Orders ED Discharge Orders          Ordered    predniSONE (DELTASONE) 20 MG tablet  Daily with breakfast        01/20/23 1647    albuterol (VENTOLIN HFA) 108 (90 Base) MCG/ACT inhaler  Every 6 hours PRN        01/20/23 1647    albuterol (PROVENTIL) (2.5 MG/3ML) 0.083% nebulizer solution  Every 6 hours PRN        01/20/23 [place], [place] A, [place] 01/20/23 1719    Hayden Rasmussen, MD 01/21/23 1223

## 2023-02-12 ENCOUNTER — Other Ambulatory Visit (HOSPITAL_BASED_OUTPATIENT_CLINIC_OR_DEPARTMENT_OTHER): Payer: Self-pay

## 2023-02-12 ENCOUNTER — Other Ambulatory Visit: Payer: Self-pay

## 2023-02-12 ENCOUNTER — Encounter (HOSPITAL_BASED_OUTPATIENT_CLINIC_OR_DEPARTMENT_OTHER): Payer: Self-pay

## 2023-02-12 ENCOUNTER — Emergency Department (HOSPITAL_BASED_OUTPATIENT_CLINIC_OR_DEPARTMENT_OTHER)
Admission: EM | Admit: 2023-02-12 | Discharge: 2023-02-12 | Disposition: A | Discharge status: Home / Self Care | Payer: Medicaid Other | Attending: Emergency Medicine | Admitting: Emergency Medicine

## 2023-02-12 DIAGNOSIS — J4520 Mild intermittent asthma, uncomplicated: Secondary | ICD-10-CM

## 2023-02-12 DIAGNOSIS — Z7951 Long term (current) use of inhaled steroids: Secondary | ICD-10-CM | POA: Insufficient documentation

## 2023-02-12 DIAGNOSIS — R0602 Shortness of breath: Secondary | ICD-10-CM | POA: Diagnosis present

## 2023-02-12 MED ORDER — PREDNISONE 20 MG PO TABS
40.0000 mg | ORAL_TABLET | Freq: Every day | ORAL | 0 refills | Status: AC
  Filled 2023-02-12: qty 10, 5d supply, fill #0

## 2023-02-12 NOTE — ED Triage Notes (Signed)
Patient here POV from Home.  Endorses SOB for about a week or so. Was seen a few weeks ago for same and was given an inhaler which has been effective and seeks Prednisone today.   NAD Noted during Triage. A&Ox4. GCS 15. Ambulatory.

## 2023-02-12 NOTE — ED Notes (Signed)
Pt leave to pick prescription upstairs before DC can be reviewed. DC teaching reviewed with family still in room.

## 2023-02-12 NOTE — Discharge Instructions (Addendum)
Im glad you are feeling better. I have sent you a prescription for prednisone to your pharmacy. It is very important that you follow up with a primary care provider for your long term asthma management. Seek emergency care if experiencing new or worsening symptoms.

## 2023-02-12 NOTE — ED Notes (Addendum)
Pt is not wheezing at this time and states he used his own inhaler before coming in. He would just like a prescription for prednisone.

## 2023-02-12 NOTE — ED Provider Notes (Signed)
[facility]   CSN: [id number] Arrival date & time: 02/12/23  1607     History  Chief Complaint  Patient presents with   Asthma    Michael Richardsugene Cutter Jr. is a 32 y.o. male with past medical history of asthma who presents to the ED complaining of shortness of breath over the past week. Endorses using albuterol inhaler 4x daily including waking up at night to use inhaler. Denies recent illness, headache, fever, chills, cough, chest pain, abdominal pain, nausea, vomiting, diarrhea.   Asthma Associated symptoms include shortness of breath.       Home Medications Prior to Admission medications   Medication Sig Start Date End Date Taking? Authorizing Provider  acetaminophen (TYLENOL) 500 MG tablet Take 1,000 mg by mouth every 8 (eight) hours as needed for moderate pain.    [provider]  albuterol (PROVENTIL) (2.5 MG/3ML) 0.083% nebulizer solution Inhae 3 mLs (2.5 mg total) by nebulization every 6 (six) hours as needed for wheezing or shortness of breath. 01/20/23   Peter Garterobbins, Cooper A, PA  albuterol (VENTOLIN HFA) 108 (90 Base) MCG/ACT inhaler Inhale 1-2 puffs into the lungs every 6 (six) hours as needed for wheezing or shortness of breath. 01/20/23   Peter Garterobbins, Cooper A, PA  amoxicillin-clavulanate (AUGMENTIN) 875-125 MG tablet Take 1 tablet by mouth every 12 (twelve) hours. 12/13/22   Radford PaxMayer, Jodi R, NP  amphetamine-dextroamphetamine (ADDERALL) 30 MG tablet Take 30 mg by mouth daily.    [provider]  busPIRone (BUSPAR) 10 MG tablet Take 10 mg by mouth daily. 10/10/21   [provider]  ipratropium-albuterol (DUONEB) 0.5-2.5 (3) MG/3ML SOLN Take 3 mLs by nebulization every 6 (six) hours as needed. Use 3 times daily x 3 days then every 6 hours as needed. 02/18/17   Rodolph Bonghompson, Daniel V, MD  lidocaine (XYLOCAINE) 2 % solution Use as directed 15 mLs in the mouth or throat as needed for mouth pain. 12/16/22   Wallis BambergMani, Mario,  PA-C  naproxen (NAPROSYN) 500 MG tablet Take 1 tablet (500 mg total) by mouth 2 (two) times daily with a meal. 12/16/22   Wallis BambergMani, Mario, PA-C  predniSONE (DELTASONE) 20 MG tablet Take 2 tablets (40 mg total) by mouth daily with breakfast for 5 days. 02/12/23 02/17/23  Dorthy CoolerMeredith, Enma Maeda F, PA-C  traZODone (DESYREL) 100 MG tablet Take 100 mg by mouth at bedtime.    [provider]      Allergies    Patient has no known allergies.    Review of Systems   Review of Systems  Respiratory:  Positive for shortness of breath.     Physical Exam Updated Vital Signs BP 116/78   Pulse 73   Temp 98.5 F (36.9 C) (Oral)   Resp 18   Ht 6' (1.829 m)   Wt 85.7 kg   SpO2 100%   BMI 25.62 kg/m  Physical Exam Vitals and nursing note reviewed.  Constitutional:      General: He is not in acute distress.    Appearance: Normal appearance. He is not ill-appearing or toxic-appearing.  HENT:     Head: Normocephalic and atraumatic.     Mouth/Throat:     Mouth: Mucous membranes are moist.     Pharynx: No oropharyngeal exudate or posterior oropharyngeal erythema.  Eyes:     General: No scleral icterus.       Right eye: No discharge.        Left eye: No discharge.  Conjunctiva/sclera: Conjunctivae normal.  Cardiovascular:     Rate and Rhythm: Normal rate.     Pulses: Normal pulses.     Heart sounds: No murmur heard. Pulmonary:     Effort: Pulmonary effort is normal. No respiratory distress.     Breath sounds: No wheezing, rhonchi or rales.  Abdominal:     Tenderness: There is no abdominal tenderness.  Musculoskeletal:     Right lower leg: No edema.     Left lower leg: No edema.  Skin:    General: Skin is warm and dry.     Findings: No rash.  Neurological:     General: No focal deficit present.     Mental Status: He is alert. Mental status is at baseline.  Psychiatric:        Mood and Affect: Mood normal.     ED Results / Procedures / Treatments   Labs (all labs ordered are  listed, but only abnormal results are displayed) Labs Reviewed - No data to display  EKG None  Radiology No results found.  Procedures Procedures    Medications Ordered in ED Medications - No data to display  ED Course/ Medical Decision Making/ A&P                             Medical Decision Making Risk Prescription drug management.  This patient presents to the ED for concern of shortness of breath, this involves an extensive number of treatment options, and is a complaint that carries with it a high risk of complications and morbidity.  The differential diagnosis includes Anxiety, Anaphylaxis/Angioedema, CHF, Asthma, COPD, PNA, COVID/Flu/RSV,     Problem List / ED Course / Critical interventions / Medication management  5:05PM Patient was on phone when I entered room and asked me if I could come back because he was doing a virtual job interview.  5:35PM Patient comfortably reclining in bed upon second attempt to interview. No respiratory distress. No remarkable physical exam findings. Due to history of present illness and physical exam, I do not think blood labs are necessary at this time. I educated patient on importance of having a primary care provider to manage asthma symptoms. Patient agreed to set up a primary care appointment with the resources that I shared with him. I shared with patient that I do not think that he needs to be admitted for inpatient treatment at this time and patient agreed and stated that he is ready to go home. I shared that I thought an ICS would be appropriate for him at this time. Patient stated that he did not want an ICS and would prefer a 5 day course of prednisone. Patient claims that this treatment helped him best in the past. I have prescribed prednisone for patient and educated him on return precautions. Patient with stable vitals signs. Patient stable for discharge at this time.  Patient was given return precautions. Patient stable for  discharge at this time. Patient verbalized understanding of plan.  DDx: These are considered less likely due to history of present illness and physical exam findings Anaphylaxis/Angioedema: no physical exam findings Asthma/COPD/PNA/COVID/Flu/RSV: Lungs clear to auscultation bilaterally and O2 sat 100% on room air CHF: no physical exam findings TPNX: Lungs clear to auscultation bilaterally         Final Clinical Impression(s) / ED Diagnoses Final diagnoses:  Mild intermittent asthma without complication    Rx / DC Orders ED Discharge Orders  Ordered    predniSONE (DELTASONE) 20 MG tablet  Daily with breakfast        02/12/23 1738              Dorthy Cooler, [place] 02/12/23 1813    Loetta Rough, MD 02/12/23 [phone]

## 2023-02-19 IMAGING — DX DG ANKLE 2V *L*
2 series · 2 of 2 positions shown · non-contrast
Comparison: Earlier radiograph dated 08/28/2021.

CLINICAL DATA: Left ankle fracture dislocation.

EXAM:
LEFT ANKLE - 2 VIEW

[ankle ap]
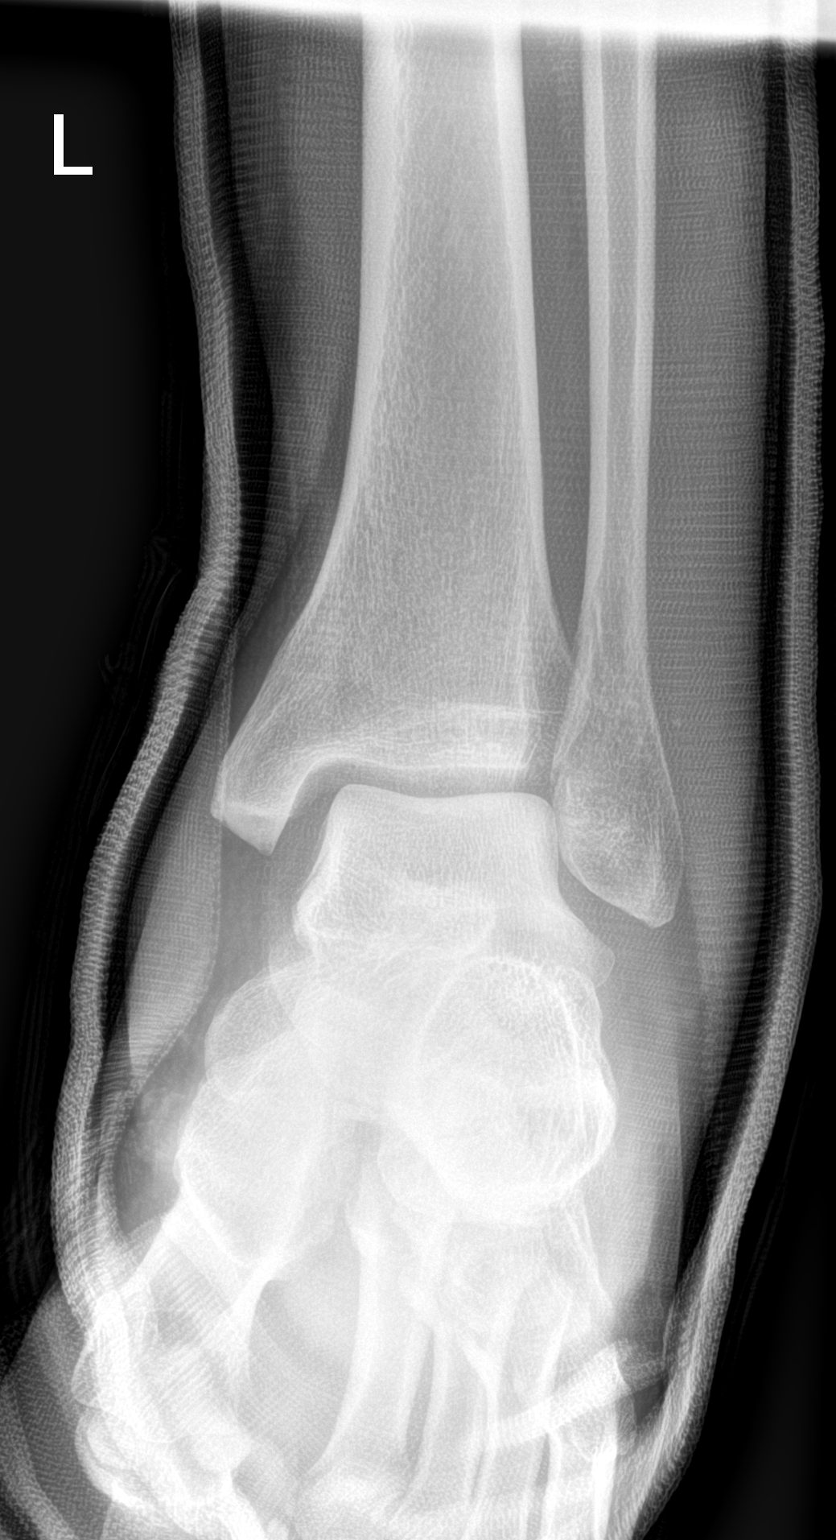

[ankle lat]
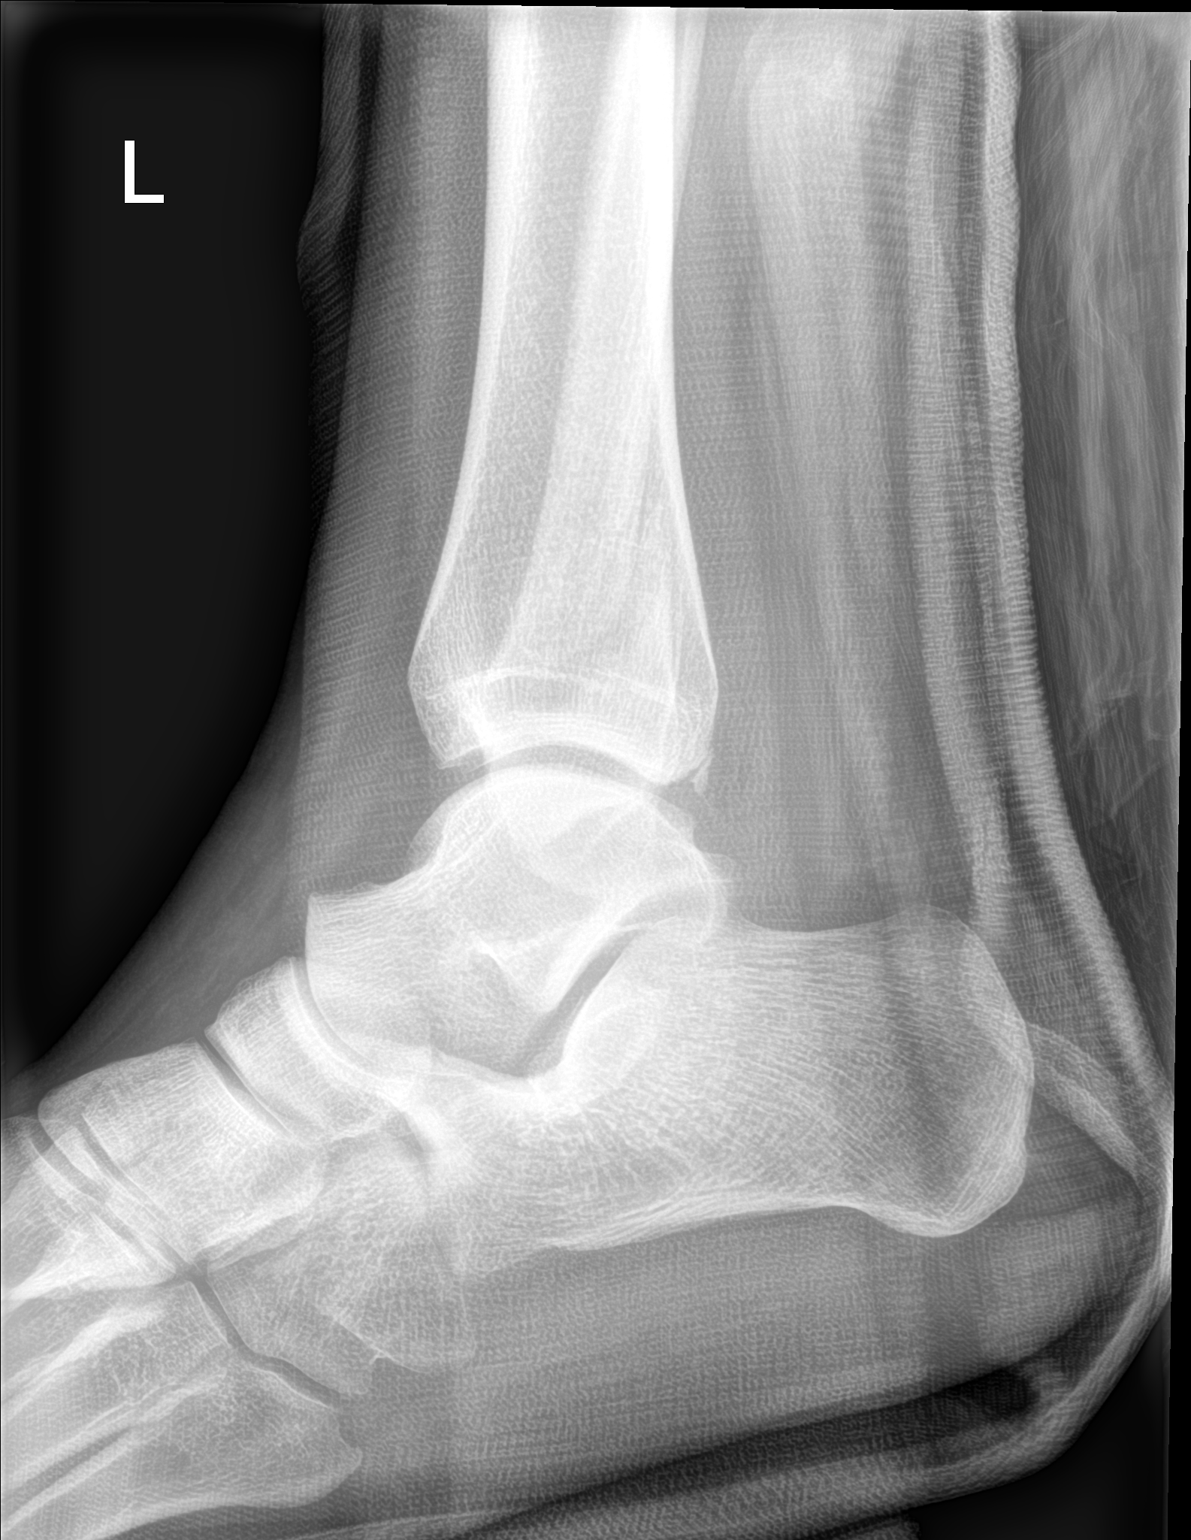

[2 of 2 positions shown; findings below may reference images not displayed]

FINDINGS: Interval reduction of the previously seen fibular fracture with near
alignment of the fibular fracture fragments. There has been interval
reduction of the dislocation of the ankle mortise. A small bone
fragment noted along the posterior malleolus. There has been
interval placement of a cast.
IMPRESSION: Interval reduction of the previously seen dislocation of the ankle
mortise and fibular fracture.
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
## 2023-02-26 ENCOUNTER — Encounter (HOSPITAL_BASED_OUTPATIENT_CLINIC_OR_DEPARTMENT_OTHER): Payer: Self-pay | Admitting: *Deleted

## 2023-02-26 ENCOUNTER — Other Ambulatory Visit: Payer: Self-pay

## 2023-02-26 ENCOUNTER — Ambulatory Visit (INDEPENDENT_AMBULATORY_CARE_PROVIDER_SITE_OTHER): Payer: Medicaid Other | Admitting: Family Medicine

## 2023-02-26 ENCOUNTER — Emergency Department (HOSPITAL_BASED_OUTPATIENT_CLINIC_OR_DEPARTMENT_OTHER)
Admission: EM | Admit: 2023-02-26 | Discharge: 2023-02-26 | Disposition: A | Discharge status: Home / Self Care | Payer: Medicaid Other | Attending: Emergency Medicine | Admitting: Emergency Medicine

## 2023-02-26 ENCOUNTER — Encounter (HOSPITAL_BASED_OUTPATIENT_CLINIC_OR_DEPARTMENT_OTHER): Payer: Self-pay | Admitting: Family Medicine

## 2023-02-26 VITALS — BP 114/80 | HR 77 | Ht 72.0 in | Wt 213.0 lb

## 2023-02-26 DIAGNOSIS — J4541 Moderate persistent asthma with (acute) exacerbation: Secondary | ICD-10-CM

## 2023-02-26 DIAGNOSIS — H579 Unspecified disorder of eye and adnexa: Secondary | ICD-10-CM | POA: Diagnosis present

## 2023-02-26 DIAGNOSIS — H43391 Other vitreous opacities, right eye: Secondary | ICD-10-CM | POA: Insufficient documentation

## 2023-02-26 MED ORDER — BUDESONIDE-FORMOTEROL FUMARATE 80-4.5 MCG/ACT IN AERO: 2.0000 | INHALATION_SPRAY | Freq: Two times a day (BID) | RESPIRATORY_TRACT | 3 refills | Status: DC

## 2023-02-26 MED ORDER — TETRACAINE HCL 0.5 % OP SOLN
2.0000 [drp] | Freq: Once | OPHTHALMIC | Status: AC
  Administered 2023-02-26: 2 [drp] via OPHTHALMIC
  Filled 2023-02-26: qty 4

## 2023-02-26 MED ORDER — FLUORESCEIN SODIUM 1 MG OP STRP
1.0000 | ORAL_STRIP | Freq: Once | OPHTHALMIC | Status: AC
  Administered 2023-02-26: 1 via OPHTHALMIC
  Filled 2023-02-26: qty 1

## 2023-02-26 NOTE — Progress Notes (Signed)
New Patient Office Visit  Subjective    Patient ID: Michael Habibi., male    DOB: 01-13-1991  Age: 32 y.o. MRN: [id number]  CC:  Chief Complaint  Patient presents with   Asthma Action Plan    Would like a referral to pulmonology   Eye Problem    Retinal detatchment surgery left eye 2014, having issues with right eye   HPI Michael Clayton. is a 32 yo male patient presents to establish care.  Had retinal detachment of L eye in 2014  He reports current issues with his right eye- over the last couple of weeks, noticed floaters, felt like he was seeing halos "over everything," white spot in center of eye. Denies eye pain, feeling of curtain shade over eye, flashes of light,  painless vision loss.    Asthma- reports using every 1 hour  Shortness of breath with exacerbation    Outpatient Encounter Medications as of 02/26/2023  Medication Sig   albuterol (PROVENTIL) (2.5 MG/3ML) 0.083% nebulizer solution Inhae 3 mLs (2.5 mg total) by nebulization every 6 (six) hours as needed for wheezing or shortness of breath.   albuterol (VENTOLIN HFA) 108 (90 Base) MCG/ACT inhaler Inhale 1-2 puffs into the lungs every 6 (six) hours as needed for wheezing or shortness of breath.   amphetamine-dextroamphetamine (ADDERALL) 30 MG tablet Take 30 mg by mouth daily.   budesonide-formoterol (SYMBICORT) 80-4.5 MCG/ACT inhaler Inhale 2 puffs into the lungs 2 (two) times daily.   ipratropium-albuterol (DUONEB) 0.5-2.5 (3) MG/3ML SOLN Take 3 mLs by nebulization every 6 (six) hours as needed. Use 3 times daily x 3 days then every 6 hours as needed.   [DISCONTINUED] acetaminophen (TYLENOL) 500 MG tablet Take 1,000 mg by mouth every 8 (eight) hours as needed for moderate pain. (Patient not taking: Reported on 02/26/2023)   [DISCONTINUED] amoxicillin-clavulanate (AUGMENTIN) 875-125 MG tablet Take 1 tablet by mouth every 12 (twelve) hours. (Patient not taking: Reported on 02/26/2023)   [DISCONTINUED] busPIRone (BUSPAR)  10 MG tablet Take 10 mg by mouth daily. (Patient not taking: Reported on 02/26/2023)   [DISCONTINUED] lidocaine (XYLOCAINE) 2 % solution Use as directed 15 mLs in the mouth or throat as needed for mouth pain. (Patient not taking: Reported on 02/26/2023)   [DISCONTINUED] naproxen (NAPROSYN) 500 MG tablet Take 1 tablet (500 mg total) by mouth 2 (two) times daily with a meal. (Patient not taking: Reported on 02/26/2023)   [DISCONTINUED] traZODone (DESYREL) 100 MG tablet Take 100 mg by mouth at bedtime. (Patient not taking: Reported on 02/26/2023)   No facility-administered encounter medications on file as of 02/26/2023.    Past Medical History:  Diagnosis Date   ADHD (attention deficit hyperactivity disorder)    Ankle fracture, left    Anxiety    Asthma as a child   only using rescue inhaler.  Triggers are pollen allergies, cats, dogs, smoke   Blurry vision, right eye    in center of right eye last 2 weeks per pt on 09-03-2021    Past Surgical History:  Procedure Laterality Date   EYE SURGERY Bilateral 2014   Detached retina on left with prophylactic laser surgery on the right as apparently at risk.   ORIF ANKLE FRACTURE Left 09/04/2021   Procedure: OPEN REDUCTION INTERNAL FIXATION (ORIF) ANKLE FRACTURE, SYNDESMOSIS FIXATION;  Surgeon: Netta Cedars, MD;  Location: [facility];  Service: Orthopedics;  Laterality: Left;    Family History  Problem Relation Age of Onset   Peptic Ulcer  Mother        History of bleeding ulcer   Sickle cell trait Brother     Social History   Socioeconomic History   Marital status: Single    Spouse name: Not on file   Number of children: 1   Years of education: 12 + CC   Highest education level: Not on file  Occupational History   Occupation: room service at [employer]  Tobacco Use   Smoking status: Former    Packs/day: 0.20    Years: 1.00    Additional pack years: 0.00    Total pack years: 0.20    Types: Cigarettes    Start  date: 05/23/2007    Quit date: 05/22/2008    Years since quitting: 14.7   Smokeless tobacco: Never  Vaping Use   Vaping Use: Never used  Substance and Sexual Activity   Alcohol use: Not Currently    Alcohol/week: 2.0 standard drinks of alcohol    Types: 2 Standard drinks or equivalent per week   Drug use: No   Sexual activity: Not on file  Other Topics Concern   Not on file  Social History Narrative   Born and raised in [place].   Currently living with mother and trying to work hard to get custody of his infant daughter.   Social Determinants of Health   Financial Resource Strain: Not on file  Food Insecurity: Not on file  Transportation Needs: Not on file  Physical Activity: Not on file  Stress: Not on file  Social Connections: Not on file  Intimate Partner Violence: Not on file    Review of Systems  Constitutional:  Negative for malaise/fatigue.  Eyes:  Positive for photophobia. Negative for pain.  Respiratory:  Positive for shortness of breath. Negative for cough and wheezing.   Cardiovascular:  Negative for chest pain and palpitations.  Gastrointestinal:  Negative for abdominal pain, nausea and vomiting.  Musculoskeletal:  Negative for myalgias.  Neurological:  Negative for dizziness, weakness and headaches.  Psychiatric/Behavioral:  Negative for depression and suicidal ideas. The patient is not nervous/anxious.    Objective    BP 114/80   Pulse 77   Ht 6' (1.829 m)   Wt 213 lb (96.6 kg)   SpO2 100%   BMI 28.89 kg/m   Physical Exam Constitutional:      Appearance: Normal appearance.  Cardiovascular:     Rate and Rhythm: Normal rate and regular rhythm.     Pulses: Normal pulses.     Heart sounds: Normal heart sounds.  Pulmonary:     Effort: Pulmonary effort is normal.     Breath sounds: Normal breath sounds.  Neurological:     Mental Status: He is alert.  Psychiatric:        Mood and Affect: Mood normal.        Behavior: Behavior  normal.        Thought Content: Thought content normal.        Judgment: Judgment normal.     Assessment & Plan:  1. Moderate persistent asthma with acute exacerbation Patient well-appearing and in no acute distress during visit. He reports utilizing his albuterol inhaler every hour on "bad days" and feels more short of breath with exertion. Cardiovascular exam with heart regular rate and rhythm. Normal heart sounds, no murmurs present. No lower extremity edema present. Lungs clear to auscultation bilaterally. No wheezing present. Treatment plan updated to twice daily Symbicort. Advised patient to  continue with rescue inhaler as needed shortness of breath and/or wheezing. Medication sent to pharmacy. Has not had formal pulmonary function tests (PFTs) performed in the past. Will refer to pulmonology.  - Ambulatory referral to Pulmonology - budesonide-formoterol (SYMBICORT) 80-4.13mcg/ACT inhaler   2. Vitreous floaters of right eye Patient has history of retinal detachment of his left eye in 2014. Currently reporting floaters in his right eye for the past couple of weeks. Denies feeling of curtain being pulled over his right eye, loss of vision, decreased in visual fields. On exam, EOMs and PERRLA intact. Less concerned for retinal detachment due to duration of symptoms. However, advised patient to go to ED at [facility] for further evaluation.   Return in about 3 months (around 05/28/2023) for asthma f/u.   Alyson Reedy, FNP

## 2023-02-26 NOTE — ED Triage Notes (Signed)
Pt is here for visual changes x1 week in right eye including seeing floaters, halo and distortion.  Pt states that this is similar to the symptoms that he had on the left eye previously when he had a retinal detachment.

## 2023-02-26 NOTE — Discharge Instructions (Addendum)
Please follow-up with the ophthalmologist I have attached your for you.  Today your ultrasound of the eye were reassuring and did not find any abnormalities including retinal detachment however is important they follow up with an ophthalmologist as you will need long-term care for your eyes.  You refused a visual acuity test and so we are unable to test visual acuity meaning we may have missed a diagnosis and symptoms may worsen.  He verbalized understanding of this and continued to want to be discharged.  Please monitor your symptoms and if symptoms worsen please return to ER.

## 2023-02-26 NOTE — ED Provider Notes (Signed)
[facility] Provider Note   CSN: [id number] Arrival date & time: 02/26/23  1406     History  Chief Complaint  Patient presents with   Eye Problem    Michael Clayton. is a 31 y.o. 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  Patient states this feels very similar to when he had retinal detachment in his left eye and was sent from his primary care provider.  Patient states he has not seen ophthalmologist in some years.  Patient denies any eye pain, history of blood clots, blood thinners, eye trauma, photophobia.  Patient does not wear glasses or contacts.  Patient denies pain with extraocular movements but does state that when he is playing video games or reading he has to focus more with his eyes.  Patient denies any foreign body sensation or history of high blood pressure.   Home Medications Prior to Admission medications   Medication Sig Start Date End Date Taking? Authorizing Provider  albuterol (PROVENTIL) (2.5 MG/3ML) 0.083% nebulizer solution Inhae 3 mLs (2.5 mg total) by nebulization every 6 (six) hours as needed for wheezing or shortness of breath. 01/20/23   Peter Garter, PA  albuterol (VENTOLIN HFA) 108 (90 Base) MCG/ACT inhaler Inhale 1-2 puffs into the lungs every 6 (six) hours as needed for wheezing or shortness of breath. 01/20/23   Sherian Maroon A, PA  amphetamine-dextroamphetamine (ADDERALL) 30 MG tablet Take 30 mg by mouth daily.    [provider]  budesonide-formoterol (SYMBICORT) 80-4.5 MCG/ACT inhaler Inhale 2 puffs into the lungs 2 (two) times daily. 02/26/23   Alyson Reedy, FNP  ipratropium-albuterol (DUONEB) 0.5-2.5 (3) MG/3ML SOLN Take 3 mLs by nebulization every 6 (six) hours as needed. Use 3 times daily x 3 days then every 6 hours as needed. 02/18/17   Rodolph Bong, MD      Allergies    Patient has no known  allergies.    Review of Systems   Review of Systems See HPI Physical Exam Updated Vital Signs BP (!) 130/58 (BP Location: Right Arm)   Pulse 66   Temp 97.8 F (36.6 C)   Resp 18   SpO2 100%  Physical Exam Eyes:     General: Lids are normal. Lids are everted, no foreign bodies appreciated. Vision grossly intact.        Right eye: No foreign body, discharge or hordeolum.     Extraocular Movements: Extraocular movements intact.     Conjunctiva/sclera: Conjunctivae normal.     Pupils: Pupils are equal, round, and reactive to light.     Right eye: No corneal abrasion or fluorescein uptake. Seidel exam negative.     Funduscopic exam:    Right eye: No hemorrhage, exudate, AV nicking or arteriolar narrowing.     Visual Fields: Right eye visual fields normal and left eye visual fields normal.     Comments: Visual fields intact Vision grossly intact Ultrasound conducted and no retinal detachment or abnormalities were noted Right intraocular pressure: 15.95 No swelling or erythema noted around the eyes     ED Results / Procedures / Treatments   Labs (all labs ordered are listed, but only abnormal results are displayed) Labs Reviewed - No data to display  EKG None  Radiology No results found.  Procedures Ultrasound ED Ocular  Date/Time: 02/26/2023 3:15 PM  Performed by: Netta Corrigan, PA-C Authorized  by: Netta Corrigan, PA-C   PROCEDURE DETAILS:    Indications: visual change     Assessed:  Right eye   Right eye axial view: obtained     Right eye sagittal view: not obtained     Images: archived     Limitations:  None RIGHT EYE FINDINGS:     no foreign body noted in right eye    right eye lens not dislodged    no retrobulbar hematoma in right eye    no evidence of retinal detachment of the right eye    no vitreous hemorrhage in right eye     Medications Ordered in ED Medications  tetracaine (PONTOCAINE) 0.5 % ophthalmic solution 2 drop (2 drops Right Eye  Given by Other 02/26/23 1444)  fluorescein ophthalmic strip 1 strip (1 strip Right Eye Given by Other 02/26/23 1444)    ED Course/ Medical Decision Making/ A&P                             Medical Decision Making Risk Prescription drug management.   Michael Clayton. 32 y.o. presented today for right eye floaters, decreased visual acuity, halos. Working DDx that I considered at this time includes, but not limited to, preorbital/orbital cellulitis, acute glaucoma, HSV infection, open globe, conjunctivitis, hordeolum/chalazion, FB, CRAO/CRVO.  R/o DDx: preorbital/orbital cellulitis, acute glaucoma, HSV infection, open globe, conjunctivitis, hordeolum/chalazion, FB, CRAO/CRVO: These are considered less likely due to history of present illness and physical exam findings  Review of prior external notes: 02/26/2023, medicine  Unique Tests and My Interpretation:  Visual Acuity: Patient refused IOP: Right eye 15.95 Fluoroscein Stain: No reuptake Ultrasound: No retinal detachment or abnormalities noted  Discussion with Independent Historian: None  Discussion of Management of Tests: None  Risk: Low: based on diagnostic testing/clinical impression and treatment plan  Risk Stratification Score: None  Plan: Patient presented for right eye floaters, decreased visual acuity, halos. On exam patient was in no acute distress and stable vitals.  Patient's fluorescein stain, extraocular movements, funduscopic exam, ultrasound were all reassuring.  Due to patient needing to pick up his daughter patient refused visual acuity test.  Patient with full decision-making capacity verbalized refusal and after informed him that without visual acuity we may miss a diagnosis which may result and worsening of symptoms and vision loss.  Patient verbalizes understanding of this and wanted to be discharged.  At this point I do not suspect any life-threatening diagnosis and patient will be given ophthalmology follow-up.   Educated patient on monitoring symptoms and the importance of following up with the ophthalmologist for long-term care of his vision.  Patient was given return precautions. Patient stable for discharge at this time.  Patient verbalized understanding of plan.         Final Clinical Impression(s) / ED Diagnoses Final diagnoses:  Vitreous floaters of right eye    Rx / DC Orders ED Discharge Orders     None         Michael Clayton 02/26/23 1600    Margarita Grizzle, MD 02/26/23 [phone]

## 2023-02-26 NOTE — ED Notes (Signed)
Pt reports seeing floaters in the right eye.

## 2023-02-26 NOTE — ED Notes (Signed)
Refuses to review DC teaching. Refuses visual acuity-PA aware. DC papers show warning of leaving before visual acuity completion. Leaves room with paperwork, pushes past this RN. Seen leaving to lobby.

## 2023-02-27 ENCOUNTER — Encounter (HOSPITAL_BASED_OUTPATIENT_CLINIC_OR_DEPARTMENT_OTHER): Payer: Self-pay | Admitting: Family Medicine

## 2023-02-27 DIAGNOSIS — H43391 Other vitreous opacities, right eye: Secondary | ICD-10-CM

## 2023-03-04 ENCOUNTER — Ambulatory Visit (INDEPENDENT_AMBULATORY_CARE_PROVIDER_SITE_OTHER): Payer: Medicaid Other

## 2023-03-04 ENCOUNTER — Encounter: Payer: Self-pay | Admitting: Podiatry

## 2023-03-04 ENCOUNTER — Ambulatory Visit (INDEPENDENT_AMBULATORY_CARE_PROVIDER_SITE_OTHER): Payer: Medicaid Other | Admitting: Podiatry

## 2023-03-04 DIAGNOSIS — M79671 Pain in right foot: Secondary | ICD-10-CM

## 2023-03-04 DIAGNOSIS — M7662 Achilles tendinitis, left leg: Secondary | ICD-10-CM

## 2023-03-04 DIAGNOSIS — M7661 Achilles tendinitis, right leg: Secondary | ICD-10-CM | POA: Diagnosis not present

## 2023-03-04 NOTE — Progress Notes (Signed)
Patient presents subjective:   Patient ID: Michael Clayton., male   DOB: 32 y.o.   MRN: [id number]   HPI Stating that he gets generalized foot pain pain in the back of his heels has flatfoot deformity and history of knee and back issues at times.  Patient does not smoke likes to be active   Review of Systems  All other systems reviewed and are negative.       Objective:  Physical Exam Vitals and nursing note reviewed.  Constitutional:      Appearance: He is well-developed.  Pulmonary:     Effort: Pulmonary effort is normal.  Musculoskeletal:        General: Normal range of motion.  Skin:    General: Skin is warm.  Neurological:     Mental Status: He is alert.     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  Discussed long-term orthotics he like to have them May but not now will get them done in future after he starts his new job to see what type of shoes they would fit best in.  Educated him on orthotics educated him on stretching exercises shoe gear modifications reappoint to recheck  X-rays indicate significant depression of the arch bilateral with moderate bunion right over left

## 2023-03-10 ENCOUNTER — Other Ambulatory Visit: Payer: Self-pay | Admitting: Podiatry

## 2023-03-10 DIAGNOSIS — M7661 Achilles tendinitis, right leg: Secondary | ICD-10-CM

## 2023-03-10 DIAGNOSIS — M79671 Pain in right foot: Secondary | ICD-10-CM

## 2023-03-18 ENCOUNTER — Ambulatory Visit (HOSPITAL_BASED_OUTPATIENT_CLINIC_OR_DEPARTMENT_OTHER): Payer: Medicaid Other | Admitting: Family Medicine

## 2023-03-20 ENCOUNTER — Encounter (HOSPITAL_BASED_OUTPATIENT_CLINIC_OR_DEPARTMENT_OTHER): Payer: Self-pay | Admitting: Pulmonary Disease

## 2023-03-20 ENCOUNTER — Ambulatory Visit (INDEPENDENT_AMBULATORY_CARE_PROVIDER_SITE_OTHER): Payer: Medicaid Other | Admitting: Pulmonary Disease

## 2023-03-20 VITALS — BP 120/72 | HR 65 | Temp 97.7°F | Ht 72.0 in | Wt 212.8 lb

## 2023-03-20 DIAGNOSIS — J4550 Severe persistent asthma, uncomplicated: Secondary | ICD-10-CM | POA: Diagnosis not present

## 2023-03-20 MED ORDER — MONTELUKAST SODIUM 10 MG PO TABS
10.0000 mg | ORAL_TABLET | Freq: Every day | ORAL | 5 refills | Status: DC
Start: 2023-03-20 — End: 2023-05-13

## 2023-03-20 MED ORDER — BREZTRI AEROSPHERE 160-9-4.8 MCG/ACT IN AERO: 2.0000 | INHALATION_SPRAY | Freq: Two times a day (BID) | RESPIRATORY_TRACT | 0 refills | Status: DC

## 2023-03-20 NOTE — Progress Notes (Signed)
Subjective:   PATIENT ID: Michael Clayton. GENDER: male DOB: [birth year], MRN: [id number]  Chief Complaint  Patient presents with   Consult    Consult. Patient here to talk about asthma.     Reason for Visit: New consult for asthma  Michael Clayton is a 32 year old male with asthma and hx left retinal detachment who presents for new consult for asthma management.   He was recently seen by his PCP, NP Charm Barges. Note 02/26/23 reviewed. Has been on rescue inhaler for shortness of breath. Started on Symbicort. Referred to Pulmonary for PFTs.  He was diagnosed with asthma in later childhood. He has been to the ED five times since 2024. He has been using [employer]. Has not picked up symbicort due to financial hardship. Reports shallow breaths and chest tightness with activity. Pollen, dust and illness will trigger. Currently no shortness of breath, cough or wheezing.   Social History: Quit smoking 14 years. Social smoker  I have personally reviewed patient's past medical/family/social history, allergies, current medications.  Past Medical History:  Diagnosis Date   ADHD (attention deficit hyperactivity disorder)    Ankle fracture, left    Anxiety    Asthma as a child   only using rescue inhaler.  Triggers are pollen allergies, cats, dogs, smoke   Blurry vision, right eye    in center of right eye last 2 weeks per pt on 09-03-2021     Family History  Problem Relation Age of Onset   Peptic Ulcer Mother        History of bleeding ulcer   Sickle cell trait Brother      Social History   Occupational History   Occupation: room service at [employer]  Tobacco Use   Smoking status: Former    Packs/day: 0.20    Years: 1.00    Additional pack years: 0.00    Total pack years: 0.20    Types: Cigarettes    Start date: 05/23/2007    Quit date: 05/22/2008    Years since quitting: 14.8   Smokeless tobacco: Never  Vaping Use   Vaping Use: Never used  Substance and Sexual  Activity   Alcohol use: Not Currently    Alcohol/week: 2.0 standard drinks of alcohol    Types: 2 Standard drinks or equivalent per week   Drug use: No   Sexual activity: Not on file    No Known Allergies   Outpatient Medications Prior to Visit  Medication Sig Dispense Refill   albuterol (PROVENTIL) (2.5 MG/3ML) 0.083% nebulizer solution Inhae 3 mLs (2.5 mg total) by nebulization every 6 (six) hours as needed for wheezing or shortness of breath. 75 mL 12   albuterol (VENTOLIN HFA) 108 (90 Base) MCG/ACT inhaler Inhale 1-2 puffs into the lungs every 6 (six) hours as needed for wheezing or shortness of breath. 18 g 0   amphetamine-dextroamphetamine (ADDERALL) 30 MG tablet Take 30 mg by mouth daily.     budesonide-formoterol (SYMBICORT) 80-4.5 MCG/ACT inhaler Inhale 2 puffs into the lungs 2 (two) times daily. 1 each 3   ipratropium-albuterol (DUONEB) 0.5-2.5 (3) MG/3ML SOLN Take 3 mLs by nebulization every 6 (six) hours as needed. Use 3 times daily x 3 days then every 6 hours as needed. 360 mL 1   No facility-administered medications prior to visit.    Review of Systems  Constitutional:  Negative for chills, diaphoresis, fever, malaise/fatigue and weight loss.  HENT:  Negative for congestion.   Respiratory:  Positive for cough, shortness of breath and wheezing. Negative for hemoptysis and sputum production.   Cardiovascular:  Positive for chest pain (chest tightness). Negative for palpitations and leg swelling.     Objective:   Vitals:   03/20/23 1026  BP: 120/72  Pulse: 65  Temp: 97.7 F (36.5 C)  TempSrc: Oral  SpO2: 99%  Weight: 212 lb 12.8 oz (96.5 kg)  Height: 6' (1.829 m)   SpO2: 99 % O2 Device: None (Room air)  Physical Exam: General: Well-appearing, no acute distress HENT: [place], AT Eyes: EOMI, no scleral icterus Respiratory: Clear to auscultation bilaterally.  No crackles, wheezing or rales Cardiovascular: RRR, -M/R/G, no JVD Extremities:-Edema,-tenderness Neuro:  AAO x4, CNII-XII grossly intact Psych: Normal mood, normal affect  Data Reviewed:  Imaging: CXR 01/20/23 - No infiltrate effusion or edema  PFT: None on file  Labs: CBC    Component Value Date/Time   WBC 4.9 02/17/2017 0056   RBC 5.04 02/17/2017 0056   HGB 14.8 02/17/2017 0056   HCT 43.7 02/17/2017 0056   PLT 205 02/17/2017 0056   MCV 86.7 02/17/2017 0056   MCH 29.4 02/17/2017 0056   MCHC 33.9 02/17/2017 0056   RDW 13.1 02/17/2017 0056   LYMPHSABS 2.0 02/17/2017 0056   MONOABS 0.3 02/17/2017 0056   EOSABS 0.3 02/17/2017 0056   BASOSABS 0.0 02/17/2017 0056   Absolute eos 02/14/17 - 300     Assessment & Plan:   Discussion: 32 year old male former social smoker with asthma and hx left retinal detachment who presents for new consult for asthma management. Discussed clinical course and management of asthma including bronchodilator regimen, preventive care and action plan for exacerbation.    Severe persistent asthma, not in exacerbation, symptomatic  --Provide Breztri samples --Provide financial assistance forms --START Symbicort TWO puffs in the morning and evening. Rinse out mouth after use --START Albuterol inhaler 1-2 puffs AS NEEDED for shortness of breath or wheezing. Ok to use before exercise --ORDER labs: CBC with diff, IgE --START singulair 10 mg daily  Health Maintenance  There is no immunization history on file for this patient. CT Lung Screen - not qualified  Orders Placed This Encounter  Procedures   CBC With Differential   IgE   Pulmonary function test    Standing Status:   Future    Standing Expiration Date:   03/19/2024    Order Specific Question:   Where should this test be performed?    Answer:   [facility]    Order Specific Question:   Full PFT: includes the following: basic spirometry, spirometry pre & post bronchodilator, diffusion capacity (DLCO), lung volumes    Answer:   Full PFT   Meds ordered this encounter  Medications    montelukast (SINGULAIR) 10 MG tablet    Sig: Take 1 tablet (10 mg total) by mouth at bedtime.    Dispense:  30 tablet    Refill:  5    Return for after PFT in June or July.  I have spent a total time of 45-minutes on the day of the appointment reviewing prior documentation, coordinating care and discussing medical diagnosis and plan with the patient/family. Imaging, labs and tests included in this note have been reviewed and interpreted independently by me.  Michael Gubbels Mechele Collin, MD [facility] 03/20/2023 11:04 AM  Office Number [phone]

## 2023-03-20 NOTE — Patient Instructions (Addendum)
Severe persistent asthma, not in exacerbation, symptomatic  --Provide Breztri samples --Provide financial assistance forms --START Symbicort TWO puffs in the morning and evening. Rinse out mouth after use --START Albuterol inhaler 1-2 puffs AS NEEDED for shortness of breath or wheezing. Ok to use before exercise --ORDER labs: CBC with diff, IgE --START singulair 10 mg daily  Go to front desk to schedule PFTs/appointment then go to lab today

## 2023-03-20 NOTE — Addendum Note (Signed)
Addended by: Arlyss Repress on: 03/20/2023 02:19 PM   Modules accepted: Orders

## 2023-03-23 ENCOUNTER — Ambulatory Visit (HOSPITAL_BASED_OUTPATIENT_CLINIC_OR_DEPARTMENT_OTHER): Payer: Medicaid Other | Admitting: Student

## 2023-03-25 LAB — CBC WITH DIFFERENTIAL
Basophils Absolute: 0 10*3/uL (ref 0.0–0.2)
Basos: 1 %
EOS (ABSOLUTE): 0.2 10*3/uL (ref 0.0–0.4)
Eos: 6 %
Hematocrit: 46 % (ref 37.5–51.0)
Hemoglobin: 15.4 g/dL (ref 13.0–17.7)
Immature Grans (Abs): 0 10*3/uL (ref 0.0–0.1)
Immature Granulocytes: 0 %
Lymphocytes Absolute: 1.3 10*3/uL (ref 0.7–3.1)
Lymphs: 49 %
MCH: 29.4 pg (ref 26.6–33.0)
MCHC: 33.5 g/dL (ref 31.5–35.7)
MCV: 88 fL (ref 79–97)
Monocytes Absolute: 0.3 10*3/uL (ref 0.1–0.9)
Monocytes: 10 %
Neutrophils Absolute: 0.9 10*3/uL — ABNORMAL LOW (ref 1.4–7.0)
Neutrophils: 34 %
RBC: 5.24 x10E6/uL (ref 4.14–5.80)
RDW: 12.4 % (ref 11.6–15.4)
WBC: 2.6 10*3/uL — ABNORMAL LOW (ref 3.4–10.8)

## 2023-03-25 LAB — IGE: IgE (Immunoglobulin E), Serum: 381 IU/mL (ref 6–495)

## 2023-05-12 ENCOUNTER — Encounter (HOSPITAL_BASED_OUTPATIENT_CLINIC_OR_DEPARTMENT_OTHER): Payer: Self-pay | Admitting: Pulmonary Disease

## 2023-05-12 ENCOUNTER — Encounter (HOSPITAL_BASED_OUTPATIENT_CLINIC_OR_DEPARTMENT_OTHER): Payer: Self-pay | Admitting: Family Medicine

## 2023-05-13 MED ORDER — MONTELUKAST SODIUM 10 MG PO TABS: 10.0000 mg | ORAL_TABLET | Freq: Every day | ORAL | 5 refills | Status: DC

## 2023-05-13 MED ORDER — BUDESONIDE-FORMOTEROL FUMARATE 80-4.5 MCG/ACT IN AERO: 2.0000 | INHALATION_SPRAY | Freq: Two times a day (BID) | RESPIRATORY_TRACT | 3 refills | Status: DC

## 2023-05-13 MED ORDER — ALBUTEROL SULFATE HFA 108 (90 BASE) MCG/ACT IN AERS: 1.0000 | INHALATION_SPRAY | Freq: Four times a day (QID) | RESPIRATORY_TRACT | 2 refills | Status: DC | PRN

## 2023-05-20 ENCOUNTER — Other Ambulatory Visit (HOSPITAL_COMMUNITY): Payer: Self-pay

## 2023-05-20 ENCOUNTER — Encounter (HOSPITAL_BASED_OUTPATIENT_CLINIC_OR_DEPARTMENT_OTHER): Payer: Self-pay | Admitting: Pulmonary Disease

## 2023-05-20 MED ORDER — FLUTICASONE FUROATE-VILANTEROL 200-25 MCG/ACT IN AEPB: 1.0000 | INHALATION_SPRAY | Freq: Every day | RESPIRATORY_TRACT | 5 refills | Status: DC

## 2023-05-21 ENCOUNTER — Ambulatory Visit
Admission: EM | Admit: 2023-05-21 | Discharge: 2023-05-21 | Disposition: A | Discharge status: Home / Self Care | Payer: BC Managed Care – PPO | Attending: Internal Medicine | Admitting: Internal Medicine

## 2023-05-21 DIAGNOSIS — U071 COVID-19: Secondary | ICD-10-CM | POA: Insufficient documentation

## 2023-05-21 DIAGNOSIS — B349 Viral infection, unspecified: Secondary | ICD-10-CM

## 2023-05-21 NOTE — Discharge Instructions (Addendum)
You may use over-the-counter medications as needed.  The clinic will contact you with results of your COVID test if it is positive.  Lots of rest and fluids.  Please follow-up with your PCP if your symptoms do not improve.  Please go to the emergency room if you develop any worsening symptoms.  I hope you feel better soon!

## 2023-05-21 NOTE — ED Provider Notes (Signed)
[facility]    CSN: [id number] Arrival date & time: 05/21/23  1826      History   Chief Complaint No chief complaint on file.   HPI Michael Clayton. is a 32 y.o. male  presents for evaluation of URI symptoms for 7 days. Patient reports associated symptoms of cough, congestion, dry mouth. Denies N/V/D, sore throat, body aches, shortness of breath.  States his symptoms are improving.  Patient does have a hx of asthma and has been using his inhaler as needed.  Reports sick contacts at work that have COVID.  He reports he had a positive home COVID test a few days ago.  He needs a PCR test for work.  Pt has taken ibuprofen Tylenol and cough medicine OTC for symptoms. Pt has no other concerns at this time.   HPI  Past Medical History:  Diagnosis Date   ADHD (attention deficit hyperactivity disorder)    Ankle fracture, left    Anxiety    Asthma as a child   only using rescue inhaler.  Triggers are pollen allergies, cats, dogs, smoke   Blurry vision, right eye    in center of right eye last 2 weeks per pt on 09-03-2021    Patient Active Problem List   Diagnosis Date Noted   Vitreous floaters of right eye 02/26/2023   Moderate persistent asthma with exacerbation 04/20/2017   Environmental and seasonal allergies 04/20/2017   Acute asthma exacerbation 02/17/2017    Past Surgical History:  Procedure Laterality Date   EYE SURGERY Bilateral 2014   Detached retina on left with prophylactic laser surgery on the right as apparently at risk.   ORIF ANKLE FRACTURE Left 09/04/2021   Procedure: OPEN REDUCTION INTERNAL FIXATION (ORIF) ANKLE FRACTURE, SYNDESMOSIS FIXATION;  Surgeon: Netta Cedars, MD;  Location: [facility];  Service: Orthopedics;  Laterality: Left;       Home Medications    Prior to Admission medications   Medication Sig Start Date End Date Taking? Authorizing Provider  albuterol (PROVENTIL) (2.5 MG/3ML) 0.083% nebulizer solution Inhae 3  mLs (2.5 mg total) by nebulization every 6 (six) hours as needed for wheezing or shortness of breath. 01/20/23   Peter Garter, PA  albuterol (VENTOLIN HFA) 108 (90 Base) MCG/ACT inhaler Inhale 1-2 puffs into the lungs every 6 (six) hours as needed for wheezing or shortness of breath. 05/13/23   Luciano Cutter, MD  amphetamine-dextroamphetamine (ADDERALL) 30 MG tablet Take 30 mg by mouth daily.    [provider]  fluticasone furoate-vilanterol (BREO ELLIPTA) 200-25 MCG/ACT AEPB Inhale 1 puff into the lungs daily. 05/20/23   Luciano Cutter, MD  ipratropium-albuterol (DUONEB) 0.5-2.5 (3) MG/3ML SOLN Take 3 mLs by nebulization every 6 (six) hours as needed. Use 3 times daily x 3 days then every 6 hours as needed. 02/18/17   Rodolph Bong, MD  montelukast (SINGULAIR) 10 MG tablet Take 1 tablet (10 mg total) by mouth at bedtime. 05/13/23   Luciano Cutter, MD    Family History Family History  Problem Relation Age of Onset   Peptic Ulcer Mother        History of bleeding ulcer   Sickle cell trait Brother     Social History Social History   Tobacco Use   Smoking status: Former    Current packs/day: 0.00    Average packs/day: 0.2 packs/day for 1 year (0.2 ttl pk-yrs)    Types: Cigarettes    Start date: 05/23/2007  Quit date: 05/22/2008    Years since quitting: 15.0   Smokeless tobacco: Never  Vaping Use   Vaping status: Never Used  Substance Use Topics   Alcohol use: Not Currently    Alcohol/week: 2.0 standard drinks of alcohol    Types: 2 Standard drinks or equivalent per week   Drug use: No     Allergies   Patient has no known allergies.   Review of Systems Review of Systems  HENT:  Positive for congestion.        Dry mouth  Respiratory:  Positive for cough.      Physical Exam Triage Vital Signs ED Triage Vitals  Encounter Vitals Group     BP 05/21/23 1904 127/85     Systolic BP Percentile --      Diastolic BP Percentile --      Pulse Rate  05/21/23 1904 75     Resp 05/21/23 1904 16     Temp 05/21/23 1904 98.1 F (36.7 C)     Temp Source 05/21/23 1904 Oral     SpO2 05/21/23 1904 97 %     Weight --      Height --      Head Circumference --      Peak Flow --      Pain Score 05/21/23 1908 0     Pain Loc --      Pain Education --      Exclude from Growth Chart --    No data found.  Updated Vital Signs BP 127/85 (BP Location: Right Arm)   Pulse 75   Temp 98.1 F (36.7 C) (Oral)   Resp 16   SpO2 97%   Visual Acuity Right Eye Distance:   Left Eye Distance:   Bilateral Distance:    Right Eye Near:   Left Eye Near:    Bilateral Near:     Physical Exam Vitals and nursing note reviewed.  Constitutional:      General: He is not in acute distress.    Appearance: Normal appearance. He is not ill-appearing or toxic-appearing.  HENT:     Head: Normocephalic and atraumatic.     Right Ear: Tympanic membrane and ear canal normal.     Left Ear: Tympanic membrane and ear canal normal.     Nose: Congestion present.     Mouth/Throat:     Mouth: Mucous membranes are moist.     Pharynx: No oropharyngeal exudate or posterior oropharyngeal erythema.  Eyes:     Pupils: Pupils are equal, round, and reactive to light.  Cardiovascular:     Rate and Rhythm: Normal rate and regular rhythm.     Heart sounds: Normal heart sounds.  Pulmonary:     Effort: Pulmonary effort is normal.     Breath sounds: Normal breath sounds. No wheezing.  Musculoskeletal:     Cervical back: Normal range of motion and neck supple.  Lymphadenopathy:     Cervical: No cervical adenopathy.  Skin:    General: Skin is warm and dry.  Neurological:     General: No focal deficit present.     Mental Status: He is alert and oriented to person, place, and time.  Psychiatric:        Mood and Affect: Mood normal.        Behavior: Behavior normal.      UC Treatments / Results  Labs (all labs ordered are listed, but only abnormal results are  displayed) Labs Reviewed  SARS  CORONAVIRUS 2 (TAT 6-24 HRS)    EKG   Radiology No results found.  Procedures Procedures (including critical care time)  Medications Ordered in UC Medications - No data to display  Initial Impression / Assessment and Plan / UC Course  I have reviewed the triage vital signs and the nursing notes.  Pertinent labs & imaging results that were available during my care of the patient were reviewed by me and considered in my medical decision making (see chart for details).     Patient with reported positive home COVID test with improving symptoms.  COVID PCR done and will contact if positive.  Discussed given length of symptoms he is to continue symptomatic treatment with over-the-counter cough medicine, ibuprofen or Tylenol, rest and fluids.  PCP follow-up if symptoms or not improving.  ER precautions reviewed and patient verbalized understanding. Final Clinical Impressions(s) / UC Diagnoses   Final diagnoses:  Viral illness     Discharge Instructions      You may use over-the-counter medications as needed.  The clinic will contact you with results of your COVID test if it is positive.  Lots of rest and fluids.  Please follow-up with your PCP if your symptoms do not improve.  Please go to the emergency room if you develop any worsening symptoms.  I hope you feel better soon!   ED Prescriptions   None    PDMP not reviewed this encounter.   Radford Pax, NP 05/21/23 [phone]

## 2023-05-21 NOTE — ED Triage Notes (Signed)
Pt presents to UC w/ c/o dry mouth, fatigue, chills, sinus pressure x1 week. Pt has used Alkacelzer plus, tylenol, ibuprofen, dayquil for symptom relief. Pt reports a "covid outbreak" at job. [facility] covid test positive

## 2023-05-22 LAB — SARS CORONAVIRUS 2 (TAT 6-24 HRS): SARS Coronavirus 2: POSITIVE — AB

## 2023-05-28 ENCOUNTER — Ambulatory Visit (HOSPITAL_BASED_OUTPATIENT_CLINIC_OR_DEPARTMENT_OTHER): Payer: Medicaid Other | Admitting: Family Medicine

## 2023-06-04 ENCOUNTER — Other Ambulatory Visit (HOSPITAL_COMMUNITY): Payer: Self-pay

## 2023-06-04 MED ORDER — BREZTRI AEROSPHERE 160-9-4.8 MCG/ACT IN AERO
2.0000 | INHALATION_SPRAY | Freq: Two times a day (BID) | RESPIRATORY_TRACT | 5 refills | Status: DC
Start: 2023-06-04 — End: 2023-07-28

## 2023-06-04 NOTE — Addendum Note (Signed)
Addended by: Luciano Cutter on: 06/04/2023 10:37 AM   Modules accepted: Orders

## 2023-06-05 ENCOUNTER — Other Ambulatory Visit (HOSPITAL_COMMUNITY): Payer: Self-pay

## 2023-07-28 ENCOUNTER — Other Ambulatory Visit (HOSPITAL_BASED_OUTPATIENT_CLINIC_OR_DEPARTMENT_OTHER): Payer: Self-pay

## 2023-07-28 ENCOUNTER — Other Ambulatory Visit: Payer: Self-pay

## 2023-07-28 ENCOUNTER — Ambulatory Visit (INDEPENDENT_AMBULATORY_CARE_PROVIDER_SITE_OTHER): Payer: BC Managed Care – PPO

## 2023-07-28 ENCOUNTER — Ambulatory Visit (INDEPENDENT_AMBULATORY_CARE_PROVIDER_SITE_OTHER): Payer: BC Managed Care – PPO | Admitting: Family Medicine

## 2023-07-28 ENCOUNTER — Encounter (HOSPITAL_BASED_OUTPATIENT_CLINIC_OR_DEPARTMENT_OTHER): Payer: Self-pay | Admitting: Family Medicine

## 2023-07-28 ENCOUNTER — Ambulatory Visit (HOSPITAL_BASED_OUTPATIENT_CLINIC_OR_DEPARTMENT_OTHER): Payer: BC Managed Care – PPO

## 2023-07-28 VITALS — BP 119/75 | HR 64 | Ht 72.0 in | Wt 220.0 lb

## 2023-07-28 DIAGNOSIS — M25561 Pain in right knee: Secondary | ICD-10-CM | POA: Diagnosis not present

## 2023-07-28 DIAGNOSIS — J4540 Moderate persistent asthma, uncomplicated: Secondary | ICD-10-CM | POA: Diagnosis not present

## 2023-07-28 MED ORDER — IPRATROPIUM-ALBUTEROL 0.5-2.5 (3) MG/3ML IN SOLN
3.0000 mL | Freq: Four times a day (QID) | RESPIRATORY_TRACT | 1 refills | Status: AC | PRN
Start: 2023-07-28 — End: ?
  Filled 2023-07-28: qty 360, 30d supply, fill #0

## 2023-07-28 MED ORDER — AIRSUPRA 90-80 MCG/ACT IN AERO
1.0000 | INHALATION_SPRAY | RESPIRATORY_TRACT | 3 refills | Status: DC | PRN
Start: 2023-07-28 — End: 2023-12-16
  Filled 2023-07-28: qty 10.7, 30d supply, fill #0

## 2023-07-28 NOTE — Progress Notes (Signed)
Acute Office Visit  Subjective:     Patient ID: Michael Clayton., male    DOB: [birth year], 32 y.o.   MRN: [id number]  Chief Complaint  Patient presents with   Knee Pain    Ongoing for about a month, injured it playing basketball. Has tried elevating, icing, and strength exercises   Asthma   Michael Clayton is a 32 year-old male patient who presents today for R knee pain x1 month. He reports swelling the day he injured it but not currently. Pain 2/10, when he starts running it starts aching.  Inside lateral. Reports there is a popping sensation when he bends his R knee . It was really painful at first, it gets worse when he is running and turning/twisting knee.  Bought some braces for support. He tried some strength exercises.  Able to walk on it  Squatting and stairs make it worse.   Review of Systems  Constitutional:  Negative for weight loss.  Respiratory:  Negative for cough and shortness of breath.   Cardiovascular:  Negative for chest pain, palpitations and leg swelling.  Musculoskeletal:  Positive for joint pain (R knee). Negative for back pain, falls, myalgias and neck pain.  Neurological:  Negative for headaches.  Psychiatric/Behavioral:  Negative for suicidal ideas.        Objective:    BP 119/75   Pulse 64   Ht 6' (1.829 m)   Wt 220 lb (99.8 kg)   SpO2 99%   BMI 29.84 kg/m   Physical Exam Constitutional:      Appearance: Normal appearance.  Cardiovascular:     Rate and Rhythm: Normal rate and regular rhythm.     Pulses: Normal pulses.     Heart sounds: Normal heart sounds.  Pulmonary:     Effort: Pulmonary effort is normal.     Breath sounds: Normal breath sounds.  Musculoskeletal:     Right hip: Normal.     Left hip: Normal.     Right upper leg: Normal.     Left upper leg: Normal.     Right knee: Decreased range of motion. Tenderness present over the MCL.     Left knee: Normal. Normal range of motion.     Right lower leg: Normal.     Left lower leg:  Normal.  Neurological:     Mental Status: He is alert.  Psychiatric:        Mood and Affect: Mood normal.        Behavior: Behavior normal.    Assessment & Plan:   1. Acute pain of right knee Patient presents today for R knee pain that occurred 1 month ago. He reports using a brace, resting and icing his knee, and performing strength training exercises. He reports the knee pain is on the medial aspect of his R knee. Physical exam remarkable for decreased ROM, tenderness to palpation, and MCL laxity. Pain along the MCL with valgus stress but no joint opening. Patient reports pain with twisting of R knee. Will start with R knee x-ray today. Once x-ray returns, most likely will require MRI to assess if a tear is present.  - DG Knee Complete 4 Views Right; Future  2. Moderate persistent asthma without exacerbation Patient reports his asthma is well controlled. Needs refill of his medication. Changing his albuterol regimen as needed to Airsupra PRN. Discussed how to use medication similarly to albuterol.  - ipratropium-albuterol (DUONEB) 0.5-2.5 (3) MG/3ML SOLN; Take 3 mLs by nebulization every 6 (  six) hours as needed. At first, use 3 times daily for 3 days then every 6 hours as needed.  Dispense: 360 mL; Refill: 1 - Albuterol-Budesonide (AIRSUPRA) 90-80 MCG/ACT AERO; Inhale 1-2 puffs into the lungs every 4 (four) hours as needed.  Dispense: 10.7 g; Refill: 3   Return if symptoms worsen or fail to improve.  Michael Reedy, FNP

## 2023-08-05 ENCOUNTER — Ambulatory Visit
Admission: RE | Admit: 2023-08-05 | Discharge: 2023-08-05 | Disposition: A | Discharge status: Home / Self Care | Payer: BC Managed Care – PPO | Source: Ambulatory Visit | Attending: Internal Medicine | Admitting: Internal Medicine

## 2023-08-05 VITALS — BP 120/73 | HR 75 | Temp 98.3°F | Resp 18

## 2023-08-05 DIAGNOSIS — Z202 Contact with and (suspected) exposure to infections with a predominantly sexual mode of transmission: Secondary | ICD-10-CM | POA: Insufficient documentation

## 2023-08-05 DIAGNOSIS — Z113 Encounter for screening for infections with a predominantly sexual mode of transmission: Secondary | ICD-10-CM | POA: Insufficient documentation

## 2023-08-05 MED ORDER — DOXYCYCLINE HYCLATE 100 MG PO CAPS: 100.0000 mg | ORAL_CAPSULE | Freq: Two times a day (BID) | ORAL | 0 refills | Status: DC

## 2023-08-05 NOTE — ED Triage Notes (Signed)
Pt states was had exposure to chlamydia. Denies sx's.
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
## 2023-08-05 NOTE — Discharge Instructions (Addendum)
Start doxycycline to cover for the infection, chlamydia, as you were exposed.  Avoid all forms of sexual intercourse (oral, vaginal, anal) for the next 7 days to avoid spreading/reinfecting or at least until we can see what kinds of infection results are positive.  Abstaining for 2 weeks would be better but at least 1 week is required.  We will let you know about your test results from the swab we did today and if you need any prescriptions for antibiotics or changes to your treatment from today.  In general, if you test positive for an sexually transmitted infection. The most reliable time to recheck if you cleared the infection is 4 weeks after you finish the antibiotic course.

## 2023-08-10 LAB — CYTOLOGY, (ORAL, ANAL, URETHRAL) ANCILLARY ONLY
Chlamydia: NEGATIVE
Comment: NEGATIVE
Comment: NEGATIVE
Comment: NORMAL
Neisseria Gonorrhea: NEGATIVE
Trichomonas: NEGATIVE

## 2023-08-14 ENCOUNTER — Other Ambulatory Visit (HOSPITAL_BASED_OUTPATIENT_CLINIC_OR_DEPARTMENT_OTHER): Payer: Self-pay | Admitting: Family Medicine

## 2023-08-14 DIAGNOSIS — M25561 Pain in right knee: Secondary | ICD-10-CM

## 2023-08-23 ENCOUNTER — Other Ambulatory Visit: Payer: Self-pay

## 2023-08-23 ENCOUNTER — Emergency Department (HOSPITAL_COMMUNITY)
Admission: EM | Admit: 2023-08-23 | Discharge: 2023-08-23 | Disposition: A | Discharge status: Home / Self Care | Payer: BC Managed Care – PPO | Attending: Emergency Medicine | Admitting: Emergency Medicine

## 2023-08-23 ENCOUNTER — Encounter (HOSPITAL_COMMUNITY): Payer: Self-pay

## 2023-08-23 DIAGNOSIS — S79921A Unspecified injury of right thigh, initial encounter: Secondary | ICD-10-CM | POA: Diagnosis not present

## 2023-08-23 DIAGNOSIS — S7011XA Contusion of right thigh, initial encounter: Secondary | ICD-10-CM | POA: Insufficient documentation

## 2023-08-23 DIAGNOSIS — T148XXA Other injury of unspecified body region, initial encounter: Secondary | ICD-10-CM

## 2023-08-23 DIAGNOSIS — S5001XA Contusion of right elbow, initial encounter: Secondary | ICD-10-CM | POA: Diagnosis not present

## 2023-08-23 NOTE — Discharge Instructions (Signed)
Contact a health care provider if: Your symptoms do not improve after several days of treatment. Your symptoms get worse. You have difficulty moving the injured area. Get help right away if: You have severe pain. You have numbness in a hand or foot. Your hand or foot turns pale or cold. 
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
## 2023-08-26 ENCOUNTER — Ambulatory Visit (HOSPITAL_BASED_OUTPATIENT_CLINIC_OR_DEPARTMENT_OTHER): Payer: BC Managed Care – PPO | Admitting: Orthopaedic Surgery

## 2023-09-17 ENCOUNTER — Ambulatory Visit: Payer: Medicaid Other

## 2023-09-17 ENCOUNTER — Encounter: Payer: Self-pay | Admitting: Emergency Medicine

## 2023-09-17 ENCOUNTER — Ambulatory Visit
Admission: EM | Admit: 2023-09-17 | Discharge: 2023-09-17 | Disposition: A | Discharge status: Home / Self Care | Payer: Medicaid Other | Attending: Internal Medicine | Admitting: Internal Medicine

## 2023-09-17 DIAGNOSIS — J4540 Moderate persistent asthma, uncomplicated: Secondary | ICD-10-CM

## 2023-09-17 MED ORDER — ALBUTEROL SULFATE HFA 108 (90 BASE) MCG/ACT IN AERS: 1.0000 | INHALATION_SPRAY | Freq: Four times a day (QID) | RESPIRATORY_TRACT | 2 refills | Status: DC | PRN

## 2023-09-17 MED ORDER — PROMETHAZINE-DM 6.25-15 MG/5ML PO SYRP: 5.0000 mL | ORAL_SOLUTION | Freq: Three times a day (TID) | ORAL | 0 refills | Status: DC | PRN

## 2023-09-17 MED ORDER — PREDNISONE 20 MG PO TABS: ORAL_TABLET | ORAL | 0 refills | Status: DC

## 2023-09-17 NOTE — ED Triage Notes (Signed)
Hx of asthma. Reports exacerbation on-going x 1 month. Has been using albuterol to manage symptoms, both MDI and nebulizer, at home. States he's tried other meds with his PCP and not been able to tolerate them long-term

## 2023-09-17 NOTE — ED Provider Notes (Signed)
[facility]  Note:  This document was prepared using Conservation officer, historic buildings and may include unintentional dictation errors.  MRN: [id number] DOB: 27-Oct-1991  Subjective:   Michael Clayton. is a 32 y.o. male presenting for 1 month history of acute onset persistent intermittent shortness of breath, wheezing, productive coughing.  Has been using his albuterol and nebulized albuterol.  Denies any particular chest pain, fevers, body pains.  Would like a refill on his albuterol inhaler.  No current facility-administered medications for this encounter.  Current Outpatient Medications:    predniSONE (DELTASONE) 20 MG tablet, Take 2 tablets daily with breakfast., Disp: 10 tablet, Rfl: 0   promethazine-dextromethorphan (PROMETHAZINE-DM) 6.25-15 MG/5ML syrup, Take 5 mLs by mouth 3 (three) times daily as needed for cough., Disp: 200 mL, Rfl: 0   albuterol (VENTOLIN HFA) 108 (90 Base) MCG/ACT inhaler, Inhale 1-2 puffs into the lungs every 6 (six) hours as needed for wheezing or shortness of breath., Disp: 18 g, Rfl: 2   Albuterol-Budesonide (AIRSUPRA) 90-80 MCG/ACT AERO, Inhale 1-2 puffs into the lungs every 4 (four) hours as needed., Disp: 10.7 g, Rfl: 3   amphetamine-dextroamphetamine (ADDERALL) 30 MG tablet, Take 30 mg by mouth daily., Disp: , Rfl:    doxycycline (VIBRAMYCIN) 100 MG capsule, Take 1 capsule (100 mg total) by mouth 2 (two) times daily., Disp: 14 capsule, Rfl: 0   ipratropium-albuterol (DUONEB) 0.5-2.5 (3) MG/3ML SOLN, Take 3 mLs by nebulization every 6 (six) hours as needed. At first, use 3 times daily for 3 days then every 6 hours as needed., Disp: 360 mL, Rfl: 1   montelukast (SINGULAIR) 10 MG tablet, Take 1 tablet (10 mg total) by mouth at bedtime., Disp: 30 tablet, Rfl: 5   No Known Allergies  Past Medical History:  Diagnosis Date   ADHD (attention deficit hyperactivity disorder)    Ankle fracture, left    Anxiety    Asthma as a child   only  using rescue inhaler.  Triggers are pollen allergies, cats, dogs, smoke   Blurry vision, right eye    in center of right eye last 2 weeks per pt on 09-03-2021     Past Surgical History:  Procedure Laterality Date   EYE SURGERY Bilateral 2014   Detached retina on left with prophylactic laser surgery on the right as apparently at risk.   ORIF ANKLE FRACTURE Left 09/04/2021   Procedure: OPEN REDUCTION INTERNAL FIXATION (ORIF) ANKLE FRACTURE, SYNDESMOSIS FIXATION;  Surgeon: Netta Cedars, MD;  Location: [facility];  Service: Orthopedics;  Laterality: Left;    Family History  Problem Relation Age of Onset   Peptic Ulcer Mother        History of bleeding ulcer   Sickle cell trait Brother     Social History   Tobacco Use   Smoking status: Former    Current packs/day: 0.00    Average packs/day: 0.2 packs/day for 1 year (0.2 ttl pk-yrs)    Types: Cigarettes    Start date: 05/23/2007    Quit date: 05/22/2008    Years since quitting: 15.3   Smokeless tobacco: Never  Vaping Use   Vaping status: Never Used  Substance Use Topics   Alcohol use: Not Currently    Alcohol/week: 2.0 standard drinks of alcohol    Types: 2 Standard drinks or equivalent per week   Drug use: No    ROS   Objective:   Vitals: BP 128/71 (BP Location: Right Arm)   Pulse  77   Temp 97.6 F (36.4 C) (Oral)   Resp 16   SpO2 96%   Physical Exam Constitutional:      General: He is not in acute distress.    Appearance: Normal appearance. He is well-developed. He is not ill-appearing, toxic-appearing or diaphoretic.  HENT:     Head: Normocephalic and atraumatic.     Right Ear: External ear normal.     Left Ear: External ear normal.     Nose: Nose normal.     Mouth/Throat:     Mouth: Mucous membranes are moist.  Eyes:     General: No scleral icterus.       Right eye: No discharge.        Left eye: No discharge.     Extraocular Movements: Extraocular movements intact.  Cardiovascular:      Rate and Rhythm: Normal rate and regular rhythm.     Heart sounds: Normal heart sounds. No murmur heard.    No friction rub. No gallop.  Pulmonary:     Effort: Pulmonary effort is normal. No respiratory distress.     Breath sounds: Normal breath sounds. No stridor. No wheezing, rhonchi or rales.  Neurological:     Mental Status: He is alert and oriented to person, place, and time.  Psychiatric:        Mood and Affect: Mood normal.        Behavior: Behavior normal.        Thought Content: Thought content normal.    DG Chest 2 View  Result Date: 09/17/2023 CLINICAL DATA:  cough, shob, wheezing EXAM: CHEST - 2 VIEW COMPARISON:  January 20, 2023 FINDINGS: The cardiomediastinal silhouette is normal in contour. No pleural effusion. No pneumothorax. Similar appearance of prominent bronchitic markings. No acute pleuroparenchymal abnormality. Visualized abdomen is unremarkable. No acute osseous abnormality noted. IMPRESSION: Similar appearance of prominent bronchitic markings consistent with history of asthma. No focal consolidation to suggest pneumonia. Electronically Signed   By: Meda Klinefelter M.D.   On: 09/17/2023 14:44     Assessment and Plan :   PDMP not reviewed this encounter.  1. Moderate persistent asthma, uncomplicated   2. Moderate persistent asthma without complication    Recommended managing with an oral prednisone course.  Refilled his albuterol inhaler.  Counseled patient on potential for adverse effects with medications prescribed/recommended today, ER and return-to-clinic precautions discussed, patient verbalized understanding.    Wallis Bamberg, [place] 09/17/23 8119

## 2023-09-17 NOTE — Discharge Instructions (Addendum)
We will update you with your x-ray results later today and make changes if necessary off of those results. Otherwise, I refilled your albuterol. Would like for you to start your prednisone course.

## 2023-09-24 ENCOUNTER — Ambulatory Visit (HOSPITAL_BASED_OUTPATIENT_CLINIC_OR_DEPARTMENT_OTHER): Payer: Medicaid Other | Admitting: Family Medicine

## 2023-09-24 ENCOUNTER — Encounter (HOSPITAL_BASED_OUTPATIENT_CLINIC_OR_DEPARTMENT_OTHER): Payer: Self-pay | Admitting: Family Medicine

## 2023-09-24 ENCOUNTER — Encounter: Payer: Self-pay | Admitting: Pulmonary Disease

## 2023-09-24 ENCOUNTER — Ambulatory Visit (INDEPENDENT_AMBULATORY_CARE_PROVIDER_SITE_OTHER): Payer: Medicaid Other | Admitting: Pulmonary Disease

## 2023-09-24 VITALS — BP 114/82 | HR 72 | Ht 72.0 in | Wt 220.6 lb

## 2023-09-24 DIAGNOSIS — J4541 Moderate persistent asthma with (acute) exacerbation: Secondary | ICD-10-CM | POA: Diagnosis not present

## 2023-09-24 MED ORDER — PROMETHAZINE-DM 6.25-15 MG/5ML PO SYRP: 5.0000 mL | ORAL_SOLUTION | Freq: Three times a day (TID) | ORAL | 0 refills | Status: DC | PRN

## 2023-09-24 MED ORDER — FLUTICASONE FUROATE-VILANTEROL 100-25 MCG/ACT IN AEPB: 1.0000 | INHALATION_SPRAY | Freq: Every day | RESPIRATORY_TRACT | 3 refills | Status: DC

## 2023-09-24 NOTE — Patient Instructions (Signed)
Prescription for Michael Clayton will be sent into pharmacy  Will refill your cough medicine  DME referral for a nebulizer machine  You need to be on a steroid inhaler on a regular basis to get the asthma controlled, you may use albuterol as needed if significant shortness of breath/wheezing  Nebulizer use as needed  Follow-up in about 3 months

## 2023-09-24 NOTE — Progress Notes (Signed)
Michael Clayton    [id number]    Feb 22, 1991  Primary Care Physician:Butler, Foye Clock, FNP  Referring Physician: Alyson Reedy, FNP [address] [place],  [place] [address]  Chief complaint:   Patient being seen for an urgent visit  HPI:  Was in the urgent care center 11/14 for exacerbation of symptoms  Daily use of albuterol, has been using his nebulizer  Has not been using the maintenance inhaler  Was initially seen by Dr. Everardo All, blood work shows elevated eosinophils and IgE  He has used samples of Breztri, Symbicort previously  Social smoker  Has been coughing with sputum production, clear phlegm Cough medicines have helped  His nebulizer unit overheated in the last couple of days, finds it helpful to have 1  Outpatient Encounter Medications as of 09/24/2023  Medication Sig   albuterol (VENTOLIN HFA) 108 (90 Base) MCG/ACT inhaler Inhale 1-2 puffs into the lungs every 6 (six) hours as needed for wheezing or shortness of breath.   Albuterol-Budesonide (AIRSUPRA) 90-80 MCG/ACT AERO Inhale 1-2 puffs into the lungs every 4 (four) hours as needed.   amphetamine-dextroamphetamine (ADDERALL) 30 MG tablet Take 30 mg by mouth daily.   fluticasone furoate-vilanterol (BREO ELLIPTA) 100-25 MCG/ACT AEPB Inhale 1 puff into the lungs daily.   ipratropium-albuterol (DUONEB) 0.5-2.5 (3) MG/3ML SOLN Take 3 mLs by nebulization every 6 (six) hours as needed. At first, use 3 times daily for 3 days then every 6 hours as needed.   montelukast (SINGULAIR) 10 MG tablet Take 1 tablet (10 mg total) by mouth at bedtime.   doxycycline (VIBRAMYCIN) 100 MG capsule Take 1 capsule (100 mg total) by mouth 2 (two) times daily. (Patient not taking: Reported on 09/24/2023)   predniSONE (DELTASONE) 20 MG tablet Take 2 tablets daily with breakfast. (Patient not taking: Reported on 09/24/2023)   promethazine-dextromethorphan (PROMETHAZINE-DM) 6.25-15 MG/5ML syrup Take 5 mLs by mouth  3 (three) times daily as needed for cough.   [DISCONTINUED] promethazine-dextromethorphan (PROMETHAZINE-DM) 6.25-15 MG/5ML syrup Take 5 mLs by mouth 3 (three) times daily as needed for cough. (Patient not taking: Reported on 09/24/2023)   No facility-administered encounter medications on file as of 09/24/2023.    Allergies as of 09/24/2023   (No Known Allergies)    Past Medical History:  Diagnosis Date   ADHD (attention deficit hyperactivity disorder)    Ankle fracture, left    Anxiety    Asthma as a child   only using rescue inhaler.  Triggers are pollen allergies, cats, dogs, smoke   Blurry vision, right eye    in center of right eye last 2 weeks per pt on 09-03-2021    Past Surgical History:  Procedure Laterality Date   EYE SURGERY Bilateral 2014   Detached retina on left with prophylactic laser surgery on the right as apparently at risk.   ORIF ANKLE FRACTURE Left 09/04/2021   Procedure: OPEN REDUCTION INTERNAL FIXATION (ORIF) ANKLE FRACTURE, SYNDESMOSIS FIXATION;  Surgeon: Netta Cedars, MD;  Location: [facility] ;  Service: Orthopedics;  Laterality: Left;    Family History  Problem Relation Age of Onset   Peptic Ulcer Mother        History of bleeding ulcer   Sickle cell trait Brother     Social History   Socioeconomic History   Marital status: Single    Spouse name: Not on file   Number of children: 1   Years of education: 51 + CC  Highest education level: Not on file  Occupational History   Occupation: room service at [employer]  Tobacco Use   Smoking status: Former    Current packs/day: 0.00    Average packs/day: 0.2 packs/day for 1 year (0.2 ttl pk-yrs)    Types: Cigarettes    Start date: 05/23/2007    Quit date: 05/22/2008    Years since quitting: 15.3   Smokeless tobacco: Never  Vaping Use   Vaping status: Never Used  Substance and Sexual Activity   Alcohol use: Not Currently    Alcohol/week: 2.0 standard drinks of alcohol     Types: 2 Standard drinks or equivalent per week   Drug use: No   Sexual activity: Not on file  Other Topics Concern   Not on file  Social History Narrative   Born and raised in [place] to [place].   Currently living with mother and trying to work hard to get custody of his infant daughter.   Social Determinants of Health   Financial Resource Strain: Not on file  Food Insecurity: Not on file  Transportation Needs: Not on file  Physical Activity: Not on file  Stress: Not on file  Social Connections: Not on file  Intimate Partner Violence: Not on file    Review of Systems  Respiratory:  Positive for shortness of breath and wheezing.     Vitals:   09/24/23 0856  BP: 114/82  Pulse: 72  SpO2: 96%     Physical Exam Constitutional:      Appearance: Normal appearance.  HENT:     Head: Normocephalic.     Mouth/Throat:     Mouth: Mucous membranes are moist.  Cardiovascular:     Rate and Rhythm: Normal rate and regular rhythm.     Heart sounds: No murmur heard.    No gallop.  Pulmonary:     Effort: No respiratory distress.     Breath sounds: No stridor.  Musculoskeletal:     Cervical back: No rigidity or tenderness.  Neurological:     Mental Status: He is alert.  Psychiatric:        Mood and Affect: Mood normal.      Data Reviewed: Recent blood work reviewed showing elevated IgE and eosinophils of 6%  Assessment:  Moderate persistent asthma  Need to use inhaled steroids on a like regular basis  Albuterol use as needed  Plan/Recommendations: Prescription for Breo sent into pharmacy  Cough medications refilled  DME referral for nebulizer unit  Follow-up with Dr. Everardo All in about 3 months   Virl Diamond MD [facility] 09/24/2023, 9:15 AM  CC: Alyson Reedy, FNP

## 2023-09-25 ENCOUNTER — Telehealth: Payer: Self-pay

## 2023-09-25 NOTE — Telephone Encounter (Signed)
*  Pulm  Pharmacy Patient Advocate Encounter   Received notification from CoverMyMeds that prior authorization for Fluticasone Furoate-Vilanterol 100-25MCG/ACT aerosol powder  is required/requested.   Insurance verification completed.   The patient is insured through [facility] .   Per test claim: PA required; PA started via CoverMyMeds. KEY [id number] . Waiting for clinical questions to populate.

## 2023-10-13 NOTE — Telephone Encounter (Signed)
PT is calling again today and to clarify he wants Virgel Bouquet (Not Breztri) . Please advise pt of action taken.   [phone]

## 2023-10-14 ENCOUNTER — Other Ambulatory Visit (HOSPITAL_COMMUNITY): Payer: Self-pay

## 2023-10-14 ENCOUNTER — Telehealth: Payer: Self-pay

## 2023-10-14 NOTE — Telephone Encounter (Signed)
Pharmacy Patient Advocate Encounter   Received notification from Pt Calls Messages that prior authorization for Breo Ellipta 100-25MCG/ACT aerosol powder is required/requested.   Insurance verification completed.   The patient is insured through [facility] [place] .   Per test claim: PA required; PA submitted to above mentioned insurance via CoverMyMeds Key/confirmation #/EOC [id number] Status is pending

## 2023-10-16 NOTE — Telephone Encounter (Signed)
Pharmacy Patient Advocate Encounter  Received notification from [employer] Medicaid that Prior Authorization for Breo Ellipta 100-25MCG/ACT aerosol powder has been APPROVED from 09-30-2023 to 10-13-2024   PA #/Case ID/Reference #: [facility]

## 2023-10-20 ENCOUNTER — Other Ambulatory Visit (HOSPITAL_COMMUNITY): Payer: Self-pay

## 2023-10-20 NOTE — Telephone Encounter (Signed)
Michael Clayton is now covered and coverage will expire 10/13/2024

## 2023-10-29 ENCOUNTER — Ambulatory Visit (HOSPITAL_COMMUNITY): Payer: Self-pay

## 2023-11-11 ENCOUNTER — Ambulatory Visit: Payer: Medicaid Other

## 2023-11-11 ENCOUNTER — Ambulatory Visit
Admission: EM | Admit: 2023-11-11 | Discharge: 2023-11-11 | Disposition: A | Discharge status: Home / Self Care | Payer: Medicaid Other | Attending: Family Medicine | Admitting: Family Medicine

## 2023-11-11 DIAGNOSIS — Q386 Other congenital malformations of mouth: Secondary | ICD-10-CM | POA: Diagnosis not present

## 2023-11-11 NOTE — Discharge Instructions (Signed)
 You have signs and symptoms of fordyce spots. You have declined testing for herpes simplex, syphilis and other sexually transmitted infections. These are not infections or warts. They are harmless. You can research them on [facility] or WebMD.

## 2023-11-11 NOTE — ED Provider Notes (Signed)
 [facility]  Note:  This document was prepared using Conservation officer, historic buildings and may include unintentional dictation errors.  MRN: [id number] DOB: 08/17/1991  Subjective:   Michael Clayton. is a 33 y.o. male presenting for 28-month history of persistent spots over the penis.  No fever, drainage of pus or bleeding, sores, no urinary symptoms.  His 1 male sex partner does not have any symptoms.  No current facility-administered medications for this encounter.  Current Outpatient Medications:    albuterol  (VENTOLIN  HFA) 108 (90 Base) MCG/ACT inhaler, Inhale 1-2 puffs into the lungs every 6 (six) hours as needed for wheezing or shortness of breath., Disp: 18 g, Rfl: 2   Albuterol -Budesonide  (AIRSUPRA ) 90-80 MCG/ACT AERO, Inhale 1-2 puffs into the lungs every 4 (four) hours as needed., Disp: 10.7 g, Rfl: 3   amphetamine-dextroamphetamine (ADDERALL) 30 MG tablet, Take 30 mg by mouth daily., Disp: , Rfl:    doxycycline  (VIBRAMYCIN ) 100 MG capsule, Take 1 capsule (100 mg total) by mouth 2 (two) times daily. (Patient not taking: Reported on 09/24/2023), Disp: 14 capsule, Rfl: 0   fluticasone  furoate-vilanterol (BREO ELLIPTA ) 100-25 MCG/ACT AEPB, Inhale 1 puff into the lungs daily., Disp: 60 each, Rfl: 3   ipratropium-albuterol  (DUONEB) 0.5-2.5 (3) MG/3ML SOLN, Take 3 mLs by nebulization every 6 (six) hours as needed. At first, use 3 times daily for 3 days then every 6 hours as needed., Disp: 360 mL, Rfl: 1   montelukast  (SINGULAIR ) 10 MG tablet, Take 1 tablet (10 mg total) by mouth at bedtime., Disp: 30 tablet, Rfl: 5   predniSONE  (DELTASONE ) 20 MG tablet, Take 2 tablets daily with breakfast. (Patient not taking: Reported on 09/24/2023), Disp: 10 tablet, Rfl: 0   promethazine -dextromethorphan (PROMETHAZINE -DM) 6.25-15 MG/5ML syrup, Take 5 mLs by mouth 3 (three) times daily as needed for cough., Disp: 200 mL, Rfl: 0   No Known Allergies  Past Medical History:   Diagnosis Date   ADHD (attention deficit hyperactivity disorder)    Ankle fracture, left    Anxiety    Asthma as a child   only using rescue inhaler.  Triggers are pollen allergies, cats, dogs, smoke   Blurry vision, right eye    in center of right eye last 2 weeks per pt on 09-03-2021     Past Surgical History:  Procedure Laterality Date   EYE SURGERY Bilateral 2014   Detached retina on left with prophylactic laser surgery on the right as apparently at risk.   ORIF ANKLE FRACTURE Left 09/04/2021   Procedure: OPEN REDUCTION INTERNAL FIXATION (ORIF) ANKLE FRACTURE, SYNDESMOSIS FIXATION;  Surgeon: Barton Drape, MD;  Location: [facility];  Service: Orthopedics;  Laterality: Left;    Family History  Problem Relation Age of Onset   Peptic Ulcer Mother        History of bleeding ulcer   Sickle cell trait Brother     Social History   Tobacco Use   Smoking status: Former    Current packs/day: 0.00    Average packs/day: 0.2 packs/day for 1 year (0.2 ttl pk-yrs)    Types: Cigarettes    Start date: 05/23/2007    Quit date: 05/22/2008    Years since quitting: 15.4   Smokeless tobacco: Never  Vaping Use   Vaping status: Never Used  Substance Use Topics   Alcohol use: Not Currently    Alcohol/week: 2.0 standard drinks of alcohol    Types: 2 Standard drinks or equivalent per week  Drug use: No    ROS   Objective:   Vitals: BP 127/69 (BP Location: Right Arm)   Pulse 86   Temp 98.3 F (36.8 C) (Oral)   Resp 16   SpO2 96%   Physical Exam Constitutional:      General: He is not in acute distress.    Appearance: Normal appearance. He is well-developed and normal weight. He is not ill-appearing, toxic-appearing or diaphoretic.  HENT:     Head: Normocephalic and atraumatic.     Right Ear: External ear normal.     Left Ear: External ear normal.     Nose: Nose normal.     Mouth/Throat:     Pharynx: Oropharynx is clear.  Eyes:     General: No scleral  icterus.       Right eye: No discharge.        Left eye: No discharge.     Extraocular Movements: Extraocular movements intact.  Cardiovascular:     Rate and Rhythm: Normal rate.  Pulmonary:     Effort: Pulmonary effort is normal.  Genitourinary:   Musculoskeletal:     Cervical back: Normal range of motion.  Neurological:     Mental Status: He is alert and oriented to person, place, and time.  Psychiatric:        Mood and Affect: Mood normal.        Behavior: Behavior normal.        Thought Content: Thought content normal.        Judgment: Judgment normal.     Assessment and Plan :   PDMP not reviewed this encounter.  1. Fordyce spots    Offered STI testing but patient declined.  Discussed the nature of Fordyce spots.  Anticipatory guidance provided.   Christopher Savannah, [place] 11/11/23 1550

## 2023-11-11 NOTE — ED Triage Notes (Signed)
 Pt states rash to his penis since 09/2023.

## 2023-11-16 ENCOUNTER — Other Ambulatory Visit: Payer: Self-pay | Admitting: Family Medicine

## 2023-11-16 ENCOUNTER — Telehealth: Payer: Self-pay | Admitting: Family Medicine

## 2023-11-16 ENCOUNTER — Encounter: Payer: Self-pay | Admitting: Family Medicine

## 2023-11-16 NOTE — Telephone Encounter (Signed)
 Copied from CRM [id number]. Topic: Referral - Request for Referral >> Nov 16, 2023 12:28 PM Powell HERO wrote: Did the patient discuss referral with their provider in the last year? Unsure  Appointment offered? No  Type of order/referral and detailed reason for visit: [place]   Preference of office, provider, location: [facility], No preference on provider  If referral order, have you been seen by this specialty before? Yes Yes, around a couple months ago for the same issue  Can we respond through MyChart?  Yes or phone, either one is fine  THIS was sent to [facility] by mistake

## 2023-11-17 ENCOUNTER — Ambulatory Visit: Payer: Medicaid Other | Admitting: Family Medicine

## 2023-11-18 ENCOUNTER — Other Ambulatory Visit (HOSPITAL_BASED_OUTPATIENT_CLINIC_OR_DEPARTMENT_OTHER): Payer: Self-pay

## 2023-11-18 ENCOUNTER — Encounter (HOSPITAL_BASED_OUTPATIENT_CLINIC_OR_DEPARTMENT_OTHER): Payer: Self-pay | Admitting: Student

## 2023-11-18 ENCOUNTER — Ambulatory Visit: Payer: Medicaid Other | Admitting: Family Medicine

## 2023-11-18 ENCOUNTER — Ambulatory Visit (HOSPITAL_BASED_OUTPATIENT_CLINIC_OR_DEPARTMENT_OTHER): Payer: Medicaid Other | Admitting: Student

## 2023-11-18 ENCOUNTER — Ambulatory Visit (HOSPITAL_BASED_OUTPATIENT_CLINIC_OR_DEPARTMENT_OTHER): Payer: Medicaid Other

## 2023-11-18 DIAGNOSIS — M5441 Lumbago with sciatica, right side: Secondary | ICD-10-CM

## 2023-11-18 MED ORDER — MELOXICAM 15 MG PO TABS
15.0000 mg | ORAL_TABLET | Freq: Every day | ORAL | 0 refills | Status: AC
  Filled 2023-11-18: qty 14, 14d supply, fill #0

## 2023-11-18 NOTE — Progress Notes (Signed)
 Chief Complaint: Low back pain     History of Present Illness:    Michael Clayton. is a 33 y.o. male presenting today for evaluation of low back pain.  This began about 3 weeks ago with no known injury.  Pain is located in the right side of the low back and radiates down through the mid posterior thigh.  Rates this at a 7/10 today.  He does have some numbness and tingling across this area.  States that he has been exercising in the gym and feels weakness in the right leg.  Has tried Tylenol  and Advil .  Enjoys running however this has been preventing him from being able to.   Surgical History:   None  PMH/PSH/Family History/Social History/Meds/Allergies:    Past Medical History:  Diagnosis Date   ADHD (attention deficit hyperactivity disorder)    Ankle fracture, left    Anxiety    Asthma as a child   only using rescue inhaler.  Triggers are pollen allergies, cats, dogs, smoke   Blurry vision, right eye    in center of right eye last 2 weeks per pt on 09-03-2021   Past Surgical History:  Procedure Laterality Date   EYE SURGERY Bilateral 2014   Detached retina on left with prophylactic laser surgery on the right as apparently at risk.   ORIF ANKLE FRACTURE Left 09/04/2021   Procedure: OPEN REDUCTION INTERNAL FIXATION (ORIF) ANKLE FRACTURE, SYNDESMOSIS FIXATION;  Surgeon: Ali Ink, MD;  Location: [facility];  Service: Orthopedics;  Laterality: Left;   Social History   Socioeconomic History   Marital status: Single    Spouse name: Not on file   Number of children: 1   Years of education: 12 + CC   Highest education level: Not on file  Occupational History   Occupation: room service at [employer]  Tobacco Use   Smoking status: Former    Current packs/day: 0.00    Average packs/day: 0.2 packs/day for 1 year (0.2 ttl pk-yrs)    Types: Cigarettes    Start date: 05/23/2007    Quit date: 05/22/2008    Years since  quitting: 15.5   Smokeless tobacco: Never  Vaping Use   Vaping status: Never Used  Substance and Sexual Activity   Alcohol use: Not Currently    Alcohol/week: 2.0 standard drinks of alcohol    Types: 2 Standard drinks or equivalent per week   Drug use: No   Sexual activity: Not on file  Other Topics Concern   Not on file  Social History Narrative   Born and raised in [place] to [place].   Currently living with mother and trying to work hard to get custody of his infant daughter.   Social Drivers of Corporate investment banker Strain: Not on file  Food Insecurity: Not on file  Transportation Needs: Not on file  Physical Activity: Not on file  Stress: Not on file  Social Connections: Not on file   Family History  Problem Relation Age of Onset   Peptic Ulcer Mother        History of bleeding ulcer   Sickle cell trait Brother    No Known Allergies Current Outpatient Medications  Medication Sig Dispense Refill   meloxicam  (MOBIC ) 15 MG tablet Take 1 tablet (  15 mg total) by mouth daily for 14 days. 14 tablet 0   albuterol  (ACCUNEB ) 0.63 MG/3ML nebulizer solution      albuterol  (VENTOLIN  HFA) 108 (90 Base) MCG/ACT inhaler Inhale 1-2 puffs into the lungs every 6 (six) hours as needed for wheezing or shortness of breath. 18 g 2   Albuterol -Budesonide  (AIRSUPRA ) 90-80 MCG/ACT AERO Inhale 1-2 puffs into the lungs every 4 (four) hours as needed. 10.7 g 3   amphetamine-dextroamphetamine (ADDERALL) 30 MG tablet Take 30 mg by mouth daily.     doxepin (SINEQUAN) 25 MG capsule Take 25 mg by mouth at bedtime.     doxepin (SINEQUAN) 50 MG capsule Take 50 mg by mouth at bedtime.     doxycycline  (VIBRAMYCIN ) 100 MG capsule Take 1 capsule (100 mg total) by mouth 2 (two) times daily. (Patient not taking: Reported on 09/24/2023) 14 capsule 0   fluticasone  furoate-vilanterol (BREO ELLIPTA ) 100-25 MCG/ACT AEPB Inhale 1 puff into the lungs daily. 60 each 3   ibuprofen  (ADVIL ) 800 MG tablet       ipratropium-albuterol  (DUONEB) 0.5-2.5 (3) MG/3ML SOLN Take 3 mLs by nebulization every 6 (six) hours as needed. At first, use 3 times daily for 3 days then every 6 hours as needed. 360 mL 1   metroNIDAZOLE (FLAGYL) 250 MG tablet Take 250 mg by mouth 3 (three) times daily.     montelukast  (SINGULAIR ) 10 MG tablet Take 1 tablet (10 mg total) by mouth at bedtime. 30 tablet 5   predniSONE  (DELTASONE ) 20 MG tablet Take 2 tablets daily with breakfast. (Patient not taking: Reported on 09/24/2023) 10 tablet 0   promethazine -dextromethorphan (PROMETHAZINE -DM) 6.25-15 MG/5ML syrup Take 5 mLs by mouth 3 (three) times daily as needed for cough. 200 mL 0   No current facility-administered medications for this visit.   No results found.  Review of Systems:   A ROS was performed including pertinent positives and negatives as documented in the HPI.  Physical Exam :   Constitutional: NAD and appears stated age Neurological: Alert and oriented Psych: Appropriate affect and cooperative There were no vitals taken for this visit.   Comprehensive Musculoskeletal Exam:    No tenderness to palpation throughout the lumbar spine or paraspinal muscles.  Pain elicited in the right lower back with trunk flexion and passive right hip flexion.  Negative straight leg raise.  Right knee extension 4/5 compared to 5/5 contralaterally.  Knee flexion and ankle dorsiflexion/plantarflexion strength 5/5.  Imaging:   Xray (lumbar spine 4 views): Mild compression deformity of the L5 vertebral body.  Normal alignment and intervertebral spacing.   I personally reviewed and interpreted the radiographs.   Assessment:   33 y.o. male with acute low back pain with sciatica radiating down the posterior right thigh.  There does appear to be a mild L5 compression deformity although the chronicity is unknown.  Will continue to monitor symptoms however I discussed that I would recommend physical therapy for initial treatment.  He  does want to be able to run and go to the gym so this will help address those goals.  Plan to start 2-week course of meloxicam  and will follow up in about 6 weeks to assess progress.  Can consider further imaging if he fails to improve significantly.  Plan :    - Referral to physical therapy - Start Meloxicam  15 mg daily     I personally saw and evaluated the patient, and participated in the management and treatment plan.  Sharrell Deck, PA-C  Orthopedics

## 2023-11-19 ENCOUNTER — Ambulatory Visit (INDEPENDENT_AMBULATORY_CARE_PROVIDER_SITE_OTHER): Payer: Medicaid Other | Admitting: Podiatry

## 2023-11-19 ENCOUNTER — Encounter: Payer: Self-pay | Admitting: Podiatry

## 2023-11-19 VITALS — Ht 72.0 in | Wt 220.0 lb

## 2023-11-19 DIAGNOSIS — M779 Enthesopathy, unspecified: Secondary | ICD-10-CM

## 2023-11-19 NOTE — Progress Notes (Signed)
Subjective:   Patient ID: Michael Clayton., male   DOB: 33 y.o.   MRN: [id number]   HPI Patient presents stating he has flatfeet and gets pain in them and also is getting back pain and was concerned about that and whether there is a relationship   ROS      Objective:  Physical Exam  Neuro vascular status intact with patient found to have significant flatfoot deformity bilateral with inflammation of the posterior tib when he is on it too much.  Localized to this area     Assessment:  Posterior tibial tendinitis that is just due to his foot structure overall with significant flatfoot deformity possibility that this involves the back     Plan:  H NP reviewed and I have recommended long-term orthotics but organ to hold off on permanent devices and today we gave him a SureStep type orthotic for good foot support.  Patient will be seen back all questions answered today

## 2023-11-25 NOTE — Therapy (Incomplete)
OUTPATIENT PHYSICAL THERAPY THORACOLUMBAR EVALUATION   Patient Name: [name]. MRN: [id number] DOB:[birth year], 33 y.o., male Today's Date: 11/25/2023  END OF SESSION:   Past Medical History:  Diagnosis Date   ADHD (attention deficit hyperactivity disorder)    Ankle fracture, left    Anxiety    Asthma as a child   only using rescue inhaler.  Triggers are pollen allergies, cats, dogs, smoke   Blurry vision, right eye    in center of right eye last 2 weeks per pt on 09-03-2021   Past Surgical History:  Procedure Laterality Date   EYE SURGERY Bilateral 2014   Detached retina on left with prophylactic laser surgery on the right as apparently at risk.   ORIF ANKLE FRACTURE Left 09/04/2021   Procedure: OPEN REDUCTION INTERNAL FIXATION (ORIF) ANKLE FRACTURE, SYNDESMOSIS FIXATION;  Surgeon: Netta Cedars, MD;  Location: [facility];  Service: Orthopedics;  Laterality: Left;   Patient Active Problem List   Diagnosis Date Noted   Moderate persistent asthma with exacerbation 04/20/2017   Environmental and seasonal allergies 04/20/2017   Acute asthma exacerbation 02/17/2017    PCP: ***  REFERRING PROVIDER: ***  REFERRING DIAG: Acute right-sided low back pain with right-sided sciatica [M54.41]   Rationale for Evaluation and Treatment: Rehabilitation  THERAPY DIAG:  No diagnosis found.  ONSET DATE: ***  SUBJECTIVE:                                                                                                                                                                                           SUBJECTIVE STATEMENT: ***  PERTINENT HISTORY:  *** Michael Gargus. is a 33 y.o. male presenting today for evaluation of low back pain.  This began about 3 weeks ago with no known injury.  Pain is located in the right side of the low back and radiates down through the mid posterior thigh.  Rates this at a 7/10 today.  He does have some numbness and tingling  across this area.  States that he has been exercising in the gym and feels weakness in the right leg.  Has tried Tylenol and Advil.  Enjoys running however this has been preventing him from being able to.  33 y.o. male with acute low back pain with sciatica radiating down the posterior right thigh.  There does appear to be a mild L5 compression deformity although the chronicity is unknown.  Will continue to monitor symptoms however I discussed that I would recommend physical therapy for initial treatment.  He does want to be able to run and go to the gym so this will help address  those goals.  Plan to start 2-week course of meloxicam and will follow up in about 6 weeks to assess progress.  Can consider further imaging if he fails to improve significantly.   PAIN:  Are you having pain? {OPRCPAIN:27236}  PRECAUTIONS: {Therapy precautions:24002}  RED FLAGS: {PT Red Flags:29287}   WEIGHT BEARING RESTRICTIONS: {Yes ***/No:24003}  FALLS:  Has patient fallen in last 6 months? {fallsyesno:27318}  LIVING ENVIRONMENT: Lives with: {OPRC lives with:25569::"lives with their family"} Lives in: {Lives in:25570} Stairs: {opstairs:27293} Has following equipment at home: {Assistive devices:23999}  OCCUPATION: ***  PLOF: {PLOF:24004}  PATIENT GOALS: ***  NEXT MD VISIT: ***  OBJECTIVE:  Note: Objective measures were completed at Evaluation unless otherwise noted.  DIAGNOSTIC FINDINGS:  ***Xray (lumbar spine 4 views): Mild compression deformity of the L5 vertebral body.  Normal alignment and intervertebral spacing.     I personally reviewed and interpreted the radiographs  PATIENT SURVEYS:  {rehab surveys:24030}  COGNITION: Overall cognitive status: {cognition:24006}     SENSATION: {sensation:27233}  MUSCLE LENGTH: Hamstrings: Right *** deg; Left *** deg Maisie Fus test: Right *** deg; Left *** deg  POSTURE: {posture:25561}  PALPATION: ***  LUMBAR ROM:   AROM eval  Flexion    Extension   Right lateral flexion   Left lateral flexion   Right rotation   Left rotation    (Blank rows = not tested)  LOWER EXTREMITY ROM:     {AROM/PROM:27142}  Right eval Left eval  Hip flexion    Hip extension    Hip abduction    Hip adduction    Hip internal rotation    Hip external rotation    Knee flexion    Knee extension    Ankle dorsiflexion    Ankle plantarflexion    Ankle inversion    Ankle eversion     (Blank rows = not tested)  LOWER EXTREMITY MMT:    MMT Right eval Left eval  Hip flexion    Hip extension    Hip abduction    Hip adduction    Hip internal rotation    Hip external rotation    Knee flexion    Knee extension    Ankle dorsiflexion    Ankle plantarflexion    Ankle inversion    Ankle eversion     (Blank rows = not tested)  LUMBAR SPECIAL TESTS:  {lumbar special test:25242}  FUNCTIONAL TESTS:  {Functional tests:24029}  GAIT: Distance walked: *** Assistive device utilized: {Assistive devices:23999} Level of assistance: {Levels of assistance:24026} Comments: ***  TREATMENT DATE: ***                                                                                                                                 PATIENT EDUCATION:  Education details: *** Person educated: {Person educated:25204} Education method: {Education Method:25205} Education comprehension: {Education Comprehension:25206}  HOME EXERCISE PROGRAM: ***  ASSESSMENT:  CLINICAL IMPRESSION: Patient is a *** y.o. *** who was  seen today for physical therapy evaluation and treatment for ***.   OBJECTIVE IMPAIRMENTS: {opptimpairments:25111}.   ACTIVITY LIMITATIONS: {activitylimitations:27494}  PARTICIPATION LIMITATIONS: {participationrestrictions:25113}  PERSONAL FACTORS: {Personal factors:25162} are also affecting patient's functional outcome.   REHAB POTENTIAL: {rehabpotential:25112}  CLINICAL DECISION MAKING: {clinical decision  making:25114}  EVALUATION COMPLEXITY: {Evaluation complexity:25115}   GOALS: Goals reviewed with patient? {yes/no:20286}  SHORT TERM GOALS: Target date: ***  *** Baseline: Goal status: INITIAL  2.  *** Baseline:  Goal status: INITIAL  3.  *** Baseline:  Goal status: INITIAL  4.  *** Baseline:  Goal status: INITIAL  5.  *** Baseline:  Goal status: INITIAL  6.  *** Baseline:  Goal status: INITIAL  LONG TERM GOALS: Target date: ***  *** Baseline:  Goal status: INITIAL  2.  *** Baseline:  Goal status: INITIAL  3.  *** Baseline:  Goal status: INITIAL  4.  *** Baseline:  Goal status: INITIAL  5.  *** Baseline:  Goal status: INITIAL  6.  *** Baseline:  Goal status: INITIAL  PLAN:  PT FREQUENCY: {rehab frequency:25116}  PT DURATION: {rehab duration:25117}  PLANNED INTERVENTIONS: {rehab planned interventions:25118::"97110-Therapeutic exercises","97530- Therapeutic [phone]- Neuromuscular re-education","97535- Self JXBJ","47829- Manual therapy"}.  PLAN FOR NEXT SESSION: Michael Clayton, PT, DPT  11/25/2023 5:40 PM

## 2023-11-26 ENCOUNTER — Ambulatory Visit: Payer: Medicaid Other | Attending: Student

## 2023-12-16 ENCOUNTER — Encounter (HOSPITAL_BASED_OUTPATIENT_CLINIC_OR_DEPARTMENT_OTHER): Payer: Self-pay | Admitting: Family Medicine

## 2023-12-16 ENCOUNTER — Ambulatory Visit: Payer: Medicaid Other | Admitting: Family Medicine

## 2023-12-16 ENCOUNTER — Ambulatory Visit (INDEPENDENT_AMBULATORY_CARE_PROVIDER_SITE_OTHER): Payer: Medicaid Other | Admitting: Family Medicine

## 2023-12-16 VITALS — BP 121/74 | HR 67 | Ht 72.0 in | Wt 219.0 lb

## 2023-12-16 DIAGNOSIS — G8929 Other chronic pain: Secondary | ICD-10-CM

## 2023-12-16 DIAGNOSIS — Q386 Other congenital malformations of mouth: Secondary | ICD-10-CM | POA: Diagnosis not present

## 2023-12-16 DIAGNOSIS — M545 Low back pain, unspecified: Secondary | ICD-10-CM

## 2023-12-16 DIAGNOSIS — M778 Other enthesopathies, not elsewhere classified: Secondary | ICD-10-CM | POA: Diagnosis not present

## 2023-12-16 NOTE — Patient Instructions (Signed)
Elbow Pain:  Use Ice, Ibuprofen for pain relief.  Try use of a elbow compression band as well.     Penile Lesions :  Try Salicylic Acid (acne) treatment.  Avoid Oils/thick oil lotions.

## 2023-12-16 NOTE — Progress Notes (Signed)
Acute Care Office Visit  Subjective:   Michael Clayton. Mar 20, 1991 12/16/2023  Chief Complaint  Patient presents with   Back Pain    Patient has been having lower back pain. States he was told he had a "tilted disc" which is causing a lot of the pain. Also has a problem on his left elbow that he wants to have looked at.    HPI: BACK PAIN:   Michael Clayton. is a 33 year old male with a history of moderate persistent asthma, environmental allergies, partial retinal detachment and chronic lumbar pain who presents with complaint of back pain.     Onset: 1-2 months prior.  Patient was seen by Hazle Nordmann orthopedic PA on 11/18/2023 for acute right sided back pain with right-sided sciatica.  He was provided meloxicam, stretches, and had a lumbar x-ray at his visit.  X-ray did show mild compression deformity at the L5 vertebral body.  Normal alignment and intervertebral spacing.  He reports significant improvement of sciatic pain, but states he is still having pain with rest and exercise.  He is going to the chiropractor for spinal manipulation and has been performing stretches, yoga, and trying to straighten posture regularly without improvement.  He is still taking meloxicam as directed.  Per orthopedist, formal PT evaluation and treatment may be beneficial to patient as well.  Patient has not pursued this yet.  Aggravating Factors: None Alleviating Factors: Meloxicam   ASSOCIATED SYMPTOMS:  Chills: no Dysuria: no Abdominal Pain: no Nausea: no Vomiting: no Hematuria: no Dizziness: no Bowel or Bladder Dysfunction: no Numbness: no Weakness: no   LEFT ELBOW CONCERN:  Patient reports new onset of discomfort to his medial aspect of the left elbow ongoing for 1 to 2 weeks.Patent states "his vein may be rolling to the outside" on his left elbow. He states it is difficult to lift heavy weights intermittently.  Patient denies numbness, tingling, decreased range of motion,  swelling, rash, or weakness to left upper extremity.  He denies any recent accident or injury.   FORDYCE SPOTS:  Patient was seen in the [facility] urgent care on 11/11/2023 for persistent spots over the penis.  He denied any drainage, bleeding, sores or urinary symptoms.  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  Patient states he has been using OTC lotions for eczema, coconut oil without relief.  He denies bleeding, drainage, progression or worsening of lesions.  Denies dysuria, frequency or urgency of urination.   The following portions of the patient's history were reviewed and updated as appropriate: past medical history, past surgical history, family history, social history, allergies, medications, and problem list.   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  Triggers are pollen allergies, cats, dogs, smoke   Blurry vision, right eye    in center of right  eye last 2 weeks per pt on 09-03-2021   Closed bimalleolar fracture of left ankle 08/29/2021   Pain of joint of left ankle and foot 12/12/2021   Past Surgical History:  Procedure Laterality Date   EYE SURGERY Bilateral 2014   Detached retina on left with prophylactic laser surgery on the right as apparently at risk.   ORIF ANKLE FRACTURE Left 09/04/2021   Procedure: OPEN REDUCTION INTERNAL  FIXATION (ORIF) ANKLE FRACTURE, SYNDESMOSIS FIXATION;  Surgeon: Netta Cedars, MD;  Location: [facility] [place];  Service: Orthopedics;  Laterality: Left;   Family History  Problem Relation Age of Onset   Peptic Ulcer Mother        History of bleeding ulcer   Sickle cell trait Brother    Outpatient Medications Prior to Visit  Medication Sig Dispense Refill   albuterol (ACCUNEB) 0.63 MG/3ML nebulizer solution      albuterol (VENTOLIN HFA) 108 (90 Base) MCG/ACT inhaler Inhale 1-2 puffs into the lungs every 6 (six) hours as needed for wheezing or shortness of breath. 18 g 2   amphetamine-dextroamphetamine (ADDERALL) 30 MG tablet Take 30 mg by mouth daily.     doxepin (SINEQUAN) 25 MG capsule Take 25 mg by mouth at bedtime.     fluticasone furoate-vilanterol (BREO ELLIPTA) 100-25 MCG/ACT AEPB Inhale 1 puff into the lungs daily. 60 each 3   ibuprofen (ADVIL) 800 MG tablet      ipratropium-albuterol (DUONEB) 0.5-2.5 (3) MG/3ML SOLN Take 3 mLs by nebulization every 6 (six) hours as needed. At first, use 3 times daily for 3 days then every 6 hours as needed. 360 mL 1   Albuterol-Budesonide (AIRSUPRA) 90-80 MCG/ACT AERO Inhale 1-2 puffs into the lungs every 4 (four) hours as needed. 10.7 g 3   doxepin (SINEQUAN) 50 MG capsule Take 50 mg by mouth at bedtime.     metroNIDAZOLE (FLAGYL) 250 MG tablet Take 250 mg by mouth 3 (three) times daily.     montelukast (SINGULAIR) 10 MG tablet Take 1 tablet (10 mg total) by mouth at bedtime. 30 tablet 5   predniSONE (DELTASONE) 20 MG tablet Take 2 tablets daily with breakfast. 10 tablet 0   promethazine-dextromethorphan (PROMETHAZINE-DM) 6.25-15 MG/5ML syrup Take 5 mLs by mouth 3 (three) times daily as needed for cough. 200 mL 0   No facility-administered medications prior to visit.   No Known Allergies   ROS: A complete ROS was performed with pertinent positives/negatives noted in the HPI. The remainder of the ROS are negative.    Objective:    Today's Vitals   12/16/23 1543  BP: 121/74  Pulse: 67  SpO2: 98%  Weight: 219 lb (99.3 kg)  Height: 6' (1.829 m)    GENERAL: Well-appearing, in NAD. Well nourished.  SKIN: Pink, warm and dry. No rash, lesion, ulceration, or ecchymoses.  Head: Normocephalic. NECK: Trachea midline. Full ROM w/o pain or tenderness.  RESPIRATORY: Chest wall symmetrical. Respirations even and non-labored.  GU: Pt denied exam of lesions today.  MSK: Muscle tone and strength appropriate for age. Joints w/o tenderness, redness, or swelling.  No step-off, crepitus, or deformity to spine.  Patient has full range of motion with extension, lateral bending, flexion, and rotation.  No palpable crepitus, tenderness, or erythema present to left elbow.  Full range of motion present to left elbow with extension, flexion.  Negative for pain with active and passive range of motion testing.  EXTREMITIES: Without clubbing, cyanosis, or edema.  NEUROLOGIC: No motor or sensory deficits.  Steady, even gait. C2-C12 intact.  PSYCH/MENTAL STATUS: Alert, oriented x 3. Cooperative, appropriate mood and affect.      Assessment & Plan:  1. Chronic low back pain, unspecified back pain laterality, unspecified whether sciatica present (Primary) Discussed continuance of ibuprofen, or naproxen as needed for pain.  Continue with ice or heat, and recommended formal PT evaluation and treatment.  Patient agreeable and referral placed to PT at [facility]. - Ambulatory referral to Physical Therapy  2. Left elbow tendinitis Discussed possible triggers such as weight lifting, repetitive motion, and hyper extension of left elbow.  Recommend using ice, heat, NSAID therapy, stretching and compression band as needed.  If no improvement reach out to PCP within 2 to 3 weeks.  3. Fordyce spots Patient declined PCP to examine lesions today.  Recommend patient stop use of oil containing products and try OTC salicylic acid to affected lesion.  Avoid any  exposure to urethral area and stop use if dryness, irritation occurs.  Patient agreeable  Return in about 3 months (around 03/14/2024) for ANNUAL PHYSICAL (fasting labs at physical) .    Patient to reach out to office if new, worrisome, or unresolved symptoms arise or if no improvement in patient's condition. Patient verbalized understanding and is agreeable to treatment plan. All questions answered to patient's satisfaction.    Hilbert Bible, [place]

## 2023-12-25 ENCOUNTER — Encounter (HOSPITAL_BASED_OUTPATIENT_CLINIC_OR_DEPARTMENT_OTHER): Payer: Self-pay | Admitting: Pulmonary Disease

## 2023-12-25 ENCOUNTER — Ambulatory Visit (HOSPITAL_BASED_OUTPATIENT_CLINIC_OR_DEPARTMENT_OTHER): Payer: Medicaid Other | Admitting: Pulmonary Disease

## 2024-01-12 ENCOUNTER — Ambulatory Visit (HOSPITAL_BASED_OUTPATIENT_CLINIC_OR_DEPARTMENT_OTHER): Payer: Medicaid Other | Attending: Family Medicine | Admitting: Physical Therapy

## 2024-03-22 NOTE — Congregational Nurse Program (Signed)
 Met with client this afternoon at the [facility].  Client reports that he had been living with his girlfriend of 3 years and they broke up so he and his daughter had to leave. His daughter  Michael Clayton [phone] is currently not with him but he will be picking her up shortly. 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  Client has a history of Asthma and ADHD and takes medications for both (Albuterol , Brea, Aderall and Doxepin) he has meds and doesn't need nay assistance with those. He has a Primary Care doctor within [facility].  He will need a new pediatrician for his daughter. Client reports not having any food allergies. Plan:  Negative Covid Screening.  Nurse will e-mail client list of Pediatricians. Encouraged to continue meds as ordered.  Encouraged to set priorities and make a list of things to do. Encouraged to call [facility] for an intake. Client to let nurse know of any future needs.  Michael Clayton D. Hansel Ley MSN, Chartered loss adjuster Nurse [employer] [phone]

## 2024-03-24 ENCOUNTER — Other Ambulatory Visit (HOSPITAL_BASED_OUTPATIENT_CLINIC_OR_DEPARTMENT_OTHER): Payer: Self-pay | Admitting: Family Medicine

## 2024-03-24 NOTE — Telephone Encounter (Signed)
 Copied from CRM [id number]. Topic: Clinical - Medication Refill >> Mar 24, 2024  2:32 PM Ameerah G wrote: Medication: albuterol  (VENTOLIN  HFA) 108 (90 Base) MCG/ACT inhaler fluticasone  furoate-vilanterol (BREO ELLIPTA ) 100-25 MCG/ACT AEPB  Has the patient contacted their pharmacy? Yes (Agent: If no, request that the patient contact the pharmacy for the refill. If patient does not wish to contact the pharmacy document the reason why and proceed with request.) (Agent: If yes, when and what did the pharmacy advise?)  This is the patient's preferred pharmacy:  [id number] [place], [place] - [address] AT [address] [place] [address] Phone: [phone] Fax: [phone]  Is this the correct pharmacy for this prescription? Yes If no, delete pharmacy and type the correct one.   Has the prescription been filled recently? No  Is the patient out of the medication? Yes  Has the patient been seen for an appointment in the last year OR does the patient have an upcoming appointment? Yes  Can we respond through MyChart? No  Agent: Please be advised that Rx refills may take up to 3 business days. We ask that you follow-up with your pharmacy.

## 2024-03-25 MED ORDER — ALBUTEROL SULFATE HFA 108 (90 BASE) MCG/ACT IN AERS: 1.0000 | INHALATION_SPRAY | Freq: Four times a day (QID) | RESPIRATORY_TRACT | 2 refills | Status: DC | PRN

## 2024-03-25 MED ORDER — FLUTICASONE FUROATE-VILANTEROL 100-25 MCG/ACT IN AEPB: 1.0000 | INHALATION_SPRAY | Freq: Every day | RESPIRATORY_TRACT | 3 refills | Status: DC

## 2024-03-29 ENCOUNTER — Emergency Department (HOSPITAL_BASED_OUTPATIENT_CLINIC_OR_DEPARTMENT_OTHER): Admission: EM | Admit: 2024-03-29 | Discharge: 2024-03-29 | Discharge status: Against Medical Advice

## 2024-03-29 ENCOUNTER — Encounter (HOSPITAL_COMMUNITY): Payer: Self-pay | Admitting: Emergency Medicine

## 2024-03-29 ENCOUNTER — Other Ambulatory Visit: Payer: Self-pay

## 2024-03-29 ENCOUNTER — Ambulatory Visit (HOSPITAL_COMMUNITY)
Admission: EM | Admit: 2024-03-29 | Discharge: 2024-03-29 | Disposition: A | Discharge status: Home / Self Care | Attending: Internal Medicine | Admitting: Internal Medicine

## 2024-03-29 DIAGNOSIS — M545 Low back pain, unspecified: Secondary | ICD-10-CM

## 2024-03-29 MED ORDER — PREDNISONE 20 MG PO TABS: 40.0000 mg | ORAL_TABLET | Freq: Every day | ORAL | 0 refills | Status: AC

## 2024-03-29 MED ORDER — CYCLOBENZAPRINE HCL 5 MG PO TABS: 5.0000 mg | ORAL_TABLET | Freq: Three times a day (TID) | ORAL | 0 refills | Status: DC | PRN

## 2024-03-29 NOTE — ED Notes (Signed)
 Patient called for triage, registration staff states patient left.

## 2024-03-29 NOTE — ED Triage Notes (Signed)
 Back and hip pain for 3-4 weeks.  Describes pain that radiates to the right, then the left.   No known injury.  Patient has seen a chiropractor for a year.

## 2024-03-29 NOTE — ED Provider Notes (Signed)
 [facility]    CSN: [id number] Arrival date & time: 03/29/24  1543      History   Chief Complaint Chief Complaint  Patient presents with   Back Pain    HPI Michael Clayton. is a 33 y.o. male.   33 year old male who presents urgent care with complaints of lower back pain and buttocks pain.  He reports that this started about 3 to 4 weeks ago.  He relates that when he lays down, trying to get back up is very difficult due to pain in the back.  He also relates that bending over as well as side-to-side is difficult.  He can twist and get his back to pop which helps temporarily.  He had been seeing a chiropractor prior for another issue but did talk to his chiropractor about this and his chiropractor has been doing adjustments but this is not help.  He has never seen an orthopedist.  He did have an x-ray done in January which showed some mild degenerative changes.  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  He denies any history of injury to the back or surgery to the back.  He denies any radiating pain down into the legs just into the buttocks.  He denies bowel or bladder incontinence, lower extremity numbness or tingling, difficulty walking with the exception of some pain, fevers, nausea, vomiting, abdominal pain.     Back Pain Associated symptoms: no abdominal pain, no chest pain, no dysuria and no fever     Past Medical History:  Diagnosis Date   ADHD (attention deficit hyperactivity disorder)    Ankle fracture, left    Anxiety    Asthma as a child   only using rescue inhaler.  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   ORIF ANKLE FRACTURE Left 09/04/2021   Procedure: OPEN REDUCTION INTERNAL FIXATION (ORIF) ANKLE FRACTURE, SYNDESMOSIS FIXATION;  Surgeon: Ali Ink, MD;  Location: [facility];  Service: Orthopedics;  Laterality: Left;       Home Medications    Prior to Admission medications   Medication Sig Start Date End Date Taking? Authorizing Provider  cyclobenzaprine (FLEXERIL) 5 MG tablet Take 1 tablet (5 mg total) by mouth every 8 (eight) hours as needed for muscle spasms. 03/29/24  Yes Neelah Mannings A, PA-C  predniSONE  (DELTASONE ) 20 MG tablet Take 2 tablets (40 mg total) by mouth daily with breakfast for 5 days. 03/29/24 04/03/24 Yes Fedrick Cefalu A, PA-C  albuterol  (ACCUNEB ) 0.63 MG/3ML nebulizer solution     [provider]  albuterol  (  VENTOLIN  HFA) 108 (90 Base) MCG/ACT inhaler Inhale 1-2 puffs into the lungs every 6 (six) hours as needed for wheezing or shortness of breath. 03/25/24   Olalere, Adewale A, MD  amphetamine-dextroamphetamine (ADDERALL) 30 MG tablet Take 30 mg by mouth daily.    [provider]  doxepin (SINEQUAN) 25 MG capsule Take 25 mg by mouth at bedtime. 11/11/23   [provider]  fluticasone  furoate-vilanterol (BREO ELLIPTA ) 100-25 MCG/ACT AEPB Inhale 1 puff into the lungs daily. 03/25/24   Margaretann Sharper, MD  ibuprofen  (ADVIL ) 800 MG tablet     [provider]  ipratropium-albuterol  (DUONEB) 0.5-2.5 (3) MG/3ML SOLN  Take 3 mLs by nebulization every 6 (six) hours as needed. At first, use 3 times daily for 3 days then every 6 hours as needed. 07/28/23   Wilhelmena Hanson, FNP    Family History Family History  Problem Relation Age of Onset   Peptic Ulcer Mother        History of bleeding ulcer   Sickle cell trait Brother     Social History Social History   Tobacco Use   Smoking status: Former    Current packs/day: 0.00    Average packs/day: 0.2 packs/day for 1 year (0.2 ttl pk-yrs)    Types: Cigarettes    Start date: 05/23/2007    Quit date: 05/22/2008    Years since quitting: 15.8   Smokeless tobacco: Never  Vaping Use   Vaping status: Never Used  Substance Use Topics   Alcohol use: Not Currently    Alcohol/week: 2.0 standard drinks of alcohol    Types: 2 Standard drinks or equivalent per week   Drug use: No     Allergies   Patient has no known allergies.   Review of Systems Review of Systems  Constitutional:  Negative for chills and fever.  HENT:  Negative for ear pain and sore throat.   Eyes:  Negative for pain and visual disturbance.  Respiratory:  Negative for cough and shortness of breath.   Cardiovascular:  Negative for chest pain and palpitations.  Gastrointestinal:  Negative for abdominal pain, nausea and vomiting.  Genitourinary:  Negative for dysuria and hematuria.  Musculoskeletal:  Positive for back pain. Negative for arthralgias.  Skin:  Negative for color change and rash.  Neurological:  Negative for seizures and syncope.  All other systems reviewed and are negative.    Physical Exam Triage Vital Signs ED Triage Vitals  Encounter Vitals Group     BP 03/29/24 1649 129/82     Systolic BP Percentile --      Diastolic BP Percentile --      Pulse Rate 03/29/24 1649 82     Resp 03/29/24 1649 18     Temp 03/29/24 1649 97.8 F (36.6 C)     Temp Source 03/29/24 1649 Oral     SpO2 03/29/24 1649 95 %     Weight --      Height --      Head Circumference --       Peak Flow --      Pain Score 03/29/24 1647 10     Pain Loc --      Pain Education --      Exclude from Growth Chart --    No data found.  Updated Vital Signs BP 129/82 (BP Location: Left Arm)   Pulse 82   Temp 97.8 F (36.6 C) (Oral)   Resp 18   SpO2 95%   Visual  Acuity Right Eye Distance:   Left Eye Distance:   Bilateral Distance:    Right Eye Near:   Left Eye Near:    Bilateral Near:     Physical Exam Vitals and nursing note reviewed.  Constitutional:      General: He is not in acute distress.    Appearance: He is well-developed.  HENT:     Head: Normocephalic and atraumatic.  Eyes:     Conjunctiva/sclera: Conjunctivae normal.  Cardiovascular:     Rate and Rhythm: Normal rate and regular rhythm.     Heart sounds: No murmur heard. Pulmonary:     Effort: Pulmonary effort is normal. No respiratory distress.     Breath sounds: Normal breath sounds.  Abdominal:     Palpations: Abdomen is soft.     Tenderness: There is no abdominal tenderness.  Musculoskeletal:        General: No swelling.     Cervical back: Normal and neck supple.     Thoracic back: Normal.     Lumbar back: Spasms and tenderness present. No swelling, deformity or bony tenderness. Decreased range of motion (With sitting up from a laying position). Negative right straight leg raise test and negative left straight leg raise test.       Back:  Skin:    General: Skin is warm and dry.     Capillary Refill: Capillary refill takes less than 2 seconds.  Neurological:     Mental Status: He is alert.  Psychiatric:        Mood and Affect: Mood normal.      UC Treatments / Results  Labs (all labs ordered are listed, but only abnormal results are displayed) Labs Reviewed - No data to display  EKG   Radiology No results found.  Procedures Procedures (including critical care time)  Medications Ordered in UC Medications - No data to display  Initial Impression / Assessment and Plan / UC  Course  I have reviewed the triage vital signs and the nursing notes.  Pertinent labs & imaging results that were available during my care of the patient were reviewed by me and considered in my medical decision making (see chart for details).     Acute bilateral low back pain without sciatica   The patient's lower back symptoms are most consistent with either a lumbar strain versus possible degenerative changes as seen on x-ray in January.  Reassuringly he is not having any neurological changes but does have pain that has been going on for 3 to 4 weeks as well as decreased range of motion.  We did offer an x-ray today but the patient related that he needed to be somewhere in 30 minutes and did not want to wait.  Given the symptoms and duration, we did offer treatment with prednisone  40 mg daily for 5 days as well as Flexeril 5 mg every 8 hours as needed for muscle spasms and the patient was agreeable to this.  We did discuss that he may need more advanced imaging then even then x-ray and that if his symptoms persist then he may need to follow-up with a orthopedic spine specialist.  The patient relates that he would like to go down that route now given the severity of his symptoms and will contact his primary care physician tomorrow to get a referral.  Advised patient that if his symptoms worsen he can return to urgent care.  Final Clinical Impressions(s) / UC Diagnoses   Final diagnoses:  Acute  bilateral low back pain without sciatica     Discharge Instructions      Lower back pain is most consistent with muscular strain vs degenerative changes. This may need more advanced imaging to better diagnosis this condition but we can try to improve the symptoms and if they do not improve, the orthopedics would be the next step. Discharge instructions:  Prednisone  40 mg (2 tablets) once daily for 5 days. Take this in the morning.  This is a steroid to help with inflammation and pain. Flexeril 5 mg  every 8 hours as needed for muscle spasms.  Use caution as this medication can cause drowsiness. Call PCP tomorrow morning to schedule an appointment to discuss a referral to orthopedics to discuss back pain Return to urgent care or PCP if symptoms worsen or fail to resolve.     ED Prescriptions     Medication Sig Dispense Auth. Provider   predniSONE  (DELTASONE ) 20 MG tablet Take 2 tablets (40 mg total) by mouth daily with breakfast for 5 days. 10 tablet Kule Gascoigne A, PA-C   cyclobenzaprine (FLEXERIL) 5 MG tablet Take 1 tablet (5 mg total) by mouth every 8 (eight) hours as needed for muscle spasms. 30 tablet Kreg Pesa, [place]      PDMP not reviewed this encounter.   Kreg Pesa, PA-C 03/29/24 1810

## 2024-03-29 NOTE — Discharge Instructions (Addendum)
 Lower back pain is most consistent with muscular strain vs degenerative changes. This may need more advanced imaging to better diagnosis this condition but we can try to improve the symptoms and if they do not improve, the orthopedics would be the next step. Discharge instructions:  Prednisone  40 mg (2 tablets) once daily for 5 days. Take this in the morning.  This is a steroid to help with inflammation and pain. Flexeril 5 mg every 8 hours as needed for muscle spasms.  Use caution as this medication can cause drowsiness. Call PCP tomorrow morning to schedule an appointment to discuss a referral to orthopedics to discuss back pain Return to urgent care or PCP if symptoms worsen or fail to resolve.

## 2024-04-28 ENCOUNTER — Ambulatory Visit (INDEPENDENT_AMBULATORY_CARE_PROVIDER_SITE_OTHER): Admitting: Family Medicine

## 2024-04-28 ENCOUNTER — Other Ambulatory Visit: Payer: Self-pay | Admitting: Family Medicine

## 2024-04-28 ENCOUNTER — Encounter (HOSPITAL_BASED_OUTPATIENT_CLINIC_OR_DEPARTMENT_OTHER): Payer: Self-pay | Admitting: Family Medicine

## 2024-04-28 VITALS — BP 133/83 | HR 68 | Ht 72.0 in | Wt 220.6 lb

## 2024-04-28 DIAGNOSIS — M545 Low back pain, unspecified: Secondary | ICD-10-CM

## 2024-04-28 DIAGNOSIS — G8929 Other chronic pain: Secondary | ICD-10-CM

## 2024-04-28 NOTE — Progress Notes (Signed)
    Procedures performed today:    None.  Independent interpretation of notes and tests performed by another provider:   None.  Brief History, Exam, Impression, and Recommendations:    BP 133/83 (BP Location: Right Arm, Patient Position: Sitting, Cuff Size: Normal)   Pulse 68   Ht 6' (1.829 m)   Wt 220 lb 9.6 oz (100.1 kg)   SpO2 97%   BMI 29.92 kg/m   Chronic bilateral low back pain without sciatica Assessment & Plan: Patient reports ongoing issues with low back pain.  He has had prior evaluation in our office as well as with orthopedic surgeon.  He did have visit to urgent care recently as well due to low back pain.  He reports that back pain is primarily across lower back, involving both sides.  He does not have any radiation of symptoms into lower extremities.  He denies any bowel or bladder issues.  Does not have any associated numbness or tingling.  He indicates that he wonders about need for MRI.  He has previously worked with [facility].  At urgent care he did have a prescription for steroid as well as muscle relaxer, uncertain relief with these modalities.  He previously was referred to physical therapy, however did not have this done.  He indicates that his chiropractor told him that he did not need to do PT and that he can just do exercise and go to the gym. On exam, patient is in no acute distress, vital signs stable.  He does not have any tenderness to palpation paraspinous processes in lumbar region.  No significant tenderness to palpation in paraspinal muscles within lumbar region.  Normal ambulation in office.  Normal forward flexion and extension, mild pain with each.  Negative straight leg raise bilaterally. Reviewed prior x-rays.  Discussed treatment options today.  I do feel that patient would benefit from PT, however he is declining to proceed with this today.  He is wanting to have MRI ordered.  He has had some conservative treatment with symptoms present now for few  months.  We can look to order MRI, did discuss that in some cases insurance will require physical therapy prior to approving MRI. In regards to symptom control, we reviewed medication options.  Given that he did not have significant improvement with muscle relaxer, could hold off on this for now.  We discussed use of anti-inflammatory, discussed OTC medications as well as prescription medication including meloxicam .  We also discussed possibility of proceeding with Toradol  shot in office today given that he has not utilized NSAID thus far today.  While discussing medication options, patient indicated to not worry about it and he would figure out something else.  Patient subsequently got a left and left office abruptly.  Orders: -     MR LUMBAR SPINE WO CONTRAST; Future   ___________________________________________ Kairos Panetta de Peru, MD, ABFM, CAQSM Primary Care and Sports Medicine [facility]

## 2024-04-28 NOTE — Assessment & Plan Note (Signed)
 Patient reports ongoing issues with low back pain.  He has had prior evaluation in our office as well as with orthopedic surgeon.  He did have visit to urgent care recently as well due to low back pain.  He reports that back pain is primarily across lower back, involving both sides.  He does not have any radiation of symptoms into lower extremities.  He denies any bowel or bladder issues.  Does not have any associated numbness or tingling.  He indicates that he wonders about need for MRI.  He has previously worked with [facility].  At urgent care he did have a prescription for steroid as well as muscle relaxer, uncertain relief with these modalities.  He previously was referred to physical therapy, however did not have this done.  He indicates that his chiropractor told him that he did not need to do PT and that he can just do exercise and go to the gym. On exam, patient is in no acute distress, vital signs stable.  He does not have any tenderness to palpation paraspinous processes in lumbar region.  No significant tenderness to palpation in paraspinal muscles within lumbar region.  Normal ambulation in office.  Normal forward flexion and extension, mild pain with each.  Negative straight leg raise bilaterally. Reviewed prior x-rays.  Discussed treatment options today.  I do feel that patient would benefit from PT, however he is declining to proceed with this today.  He is wanting to have MRI ordered.  He has had some conservative treatment with symptoms present now for few months.  We can look to order MRI, did discuss that in some cases insurance will require physical therapy prior to approving MRI. In regards to symptom control, we reviewed medication options.  Given that he did not have significant improvement with muscle relaxer, could hold off on this for now.  We discussed use of anti-inflammatory, discussed OTC medications as well as prescription medication including meloxicam .  We also discussed  possibility of proceeding with Toradol  shot in office today given that he has not utilized NSAID thus far today.  While discussing medication options, patient indicated to not worry about it and he would figure out something else.  Patient subsequently got a left and left office abruptly.

## 2024-05-12 ENCOUNTER — Other Ambulatory Visit

## 2024-05-12 ENCOUNTER — Telehealth (HOSPITAL_BASED_OUTPATIENT_CLINIC_OR_DEPARTMENT_OTHER): Payer: Self-pay | Admitting: *Deleted

## 2024-05-12 NOTE — Telephone Encounter (Signed)
 Copied from CRM [id number]. Topic: Referral - Question >> May 12, 2024  8:39 AM Antwanette L wrote: Reason for CRM: Cy from [facility] [employer] [facility] is calling because the patient referral for a lumbar spine was denied by his insurance provider. Ammie is requesting a callback at [phone] [phone]     Called [facility] to speak to Kristie but unable to do so.left VM for her to return call.

## 2024-05-12 NOTE — Telephone Encounter (Signed)
 Called and spoke with pt letting him know that insurance denied coverage of the MRI due to him needing to have at least 6 weeks worth of PT. While still trying to talk to pt about this to let him know that we could place the referral for PT, pt hung up on me mid sentence while speaking to him.

## 2024-05-12 NOTE — Telephone Encounter (Signed)
 Michael Clayton returned call. Insurance denied the MRI due to needing to have proof that patient has done at least 6 weeks worth of physical therapy.  Even though pt saw Dr. De Peru last due to [employer] being out of the office, since pt is her pt, routing this to her for review.

## 2024-05-18 ENCOUNTER — Inpatient Hospital Stay: Admission: RE | Admit: 2024-05-18 | Source: Ambulatory Visit

## 2024-05-18 ENCOUNTER — Other Ambulatory Visit

## 2024-06-13 ENCOUNTER — Ambulatory Visit (HOSPITAL_BASED_OUTPATIENT_CLINIC_OR_DEPARTMENT_OTHER): Admitting: Family Medicine

## 2024-06-27 ENCOUNTER — Ambulatory Visit: Payer: Self-pay

## 2024-06-27 ENCOUNTER — Ambulatory Visit (HOSPITAL_BASED_OUTPATIENT_CLINIC_OR_DEPARTMENT_OTHER): Admitting: Family Medicine

## 2024-06-27 NOTE — Telephone Encounter (Signed)
 FYI Only or Action Required?: FYI only for provider.  Patient was last seen in primary care on 04/28/2024 by de Peru, Quintin PARAS, MD.  Called Nurse Triage reporting Back Pain.  Symptoms began several months ago.  Interventions attempted: Nothing.  Symptoms are: unchanged.  Triage Disposition: See PCP When Office is Open (Within 3 Days)  Patient/caregiver understands and will follow disposition?: Yes                       Copied from CRM #[id number]. Topic: Clinical - Red Word Triage >> Jun 27, 2024  8:12 AM Carlatta H wrote: [facility] that prompted transfer to Nurse Triage: Severe back pain that has gotten worse//Patient also has eye swelling Reason for Disposition  [1] MODERATE back pain (e.g., interferes with normal activities) AND [2] present > 3 days  Answer Assessment - Initial Assessment Questions 1. ONSET: When did the pain begin? (e.g., minutes, hours, days)     For the last couple months 2. LOCATION: Where does it hurt? (upper, mid or lower back)     Lower back 3. SEVERITY: How bad is the pain?  (e.g., Scale 1-10; mild, moderate, or severe)     severe 4. PATTERN: Is the pain constant? (e.g., yes, no; constant, intermittent)      constant 5. RADIATION: Does the pain shoot into your legs or somewhere else?     Right buttock and right leg 6. CAUSE:  What do you think is causing the back pain?      Hx chronic back pain 7. BACK OVERUSE:  Any recent lifting of heavy objects, strenuous work or exercise?     denies 8. MEDICINES: What have you taken so far for the pain? (e.g., nothing, acetaminophen , NSAIDS)     Ibuprofen   9. NEUROLOGIC SYMPTOMS: Do you have any weakness, numbness, or problems with bowel/bladder control?     Sometimes when he stands he feels like he could have a bowel movement  10. OTHER SYMPTOMS: Do you have any other symptoms? (e.g., fever, abdomen pain, burning with urination, blood in urine)       no 11. PREGNANCY: Is  there any chance you are pregnant? When was your last menstrual period?       na  Protocols used: Back Pain-A-AH

## 2024-06-28 ENCOUNTER — Ambulatory Visit (INDEPENDENT_AMBULATORY_CARE_PROVIDER_SITE_OTHER): Admitting: Family Medicine

## 2024-06-28 ENCOUNTER — Encounter (HOSPITAL_BASED_OUTPATIENT_CLINIC_OR_DEPARTMENT_OTHER): Payer: Self-pay | Admitting: Family Medicine

## 2024-06-28 VITALS — BP 128/92 | HR 86 | Ht 72.0 in | Wt 226.0 lb

## 2024-06-28 DIAGNOSIS — G8929 Other chronic pain: Secondary | ICD-10-CM | POA: Diagnosis not present

## 2024-06-28 DIAGNOSIS — M545 Low back pain, unspecified: Secondary | ICD-10-CM

## 2024-06-28 DIAGNOSIS — H0014 Chalazion left upper eyelid: Secondary | ICD-10-CM

## 2024-06-28 MED ORDER — ALBUTEROL SULFATE HFA 108 (90 BASE) MCG/ACT IN AERS: 1.0000 | INHALATION_SPRAY | Freq: Four times a day (QID) | RESPIRATORY_TRACT | 2 refills | Status: AC | PRN

## 2024-06-28 MED ORDER — FLUTICASONE FUROATE-VILANTEROL 100-25 MCG/ACT IN AEPB: 1.0000 | INHALATION_SPRAY | Freq: Every day | RESPIRATORY_TRACT | 3 refills | Status: AC

## 2024-06-28 MED ORDER — METHOCARBAMOL 500 MG PO TABS: 500.0000 mg | ORAL_TABLET | Freq: Three times a day (TID) | ORAL | 0 refills | Status: DC | PRN

## 2024-06-28 MED ORDER — TRAMADOL HCL 50 MG PO TABS: 50.0000 mg | ORAL_TABLET | Freq: Two times a day (BID) | ORAL | 0 refills | Status: AC | PRN

## 2024-06-28 NOTE — Patient Instructions (Signed)
 Chalazion to left eyelid:  Warm compresses four times daily for 5-10 minutes.   If no improvement in 4 weeks, we will need to send to Ophthalmology.

## 2024-06-28 NOTE — Progress Notes (Signed)
 Subjective:   Michael Clayton. 1990-11-13 06/28/2024  Chief Complaint  Patient presents with   Back Pain    Pt has been having pain in his back for about 4 months now which has been getting worse. Was trying to have MRI performed but insurance denied due to needing PT. Pt has been experiencing some numbnessw.     HPI: Michael Clayton. presents today for re-assessment and management of chronic medical conditions.   Patient was seen by orthopedics on 11/18/2023 for acute right sided back pain with right-sided sciatica.  He was given conservative measures such as meloxicam , stretches, and lumbar x-ray performed at that time that showed a mild compression deformity at L5.  He saw PCP in February for ongoing pain that was not improving with chiropractic manipulation, yoga, and stretching.  He was recommended to start PT and referral was placed patient did not schedule.  Patient saw Dr. De Peru on 04/28/2024 and was interested in possible MRI.  He has been working with a [facility] and per chiropractor, he was not recommended to do PT.  He did not start PT per referral placed by PCP in February 2025.  MRI was placed at his order on 04/28/2024, but has been denied since patient has not performed conservative measures such as PT for at least 4 to 6 weeks.  At this time, patient is still having significant pain in the lumbar area radiating down his bilateral lower extremities.  He is requesting PT referral at this time. He states pain worsens when lying flat and with movement. He has taken Aleve , Advil , ice, and positioning without improvement.  He denies paresthesia of groin, incontinence of urine, weakness, incontinence of stool.  EYE CONCERN: Patient also reports enlarged area to the left upper eyelid that has occurred over the past 1 to 2 weeks.  States area is painless, no impairment of vision or drainage.  The following portions of the patient's history were reviewed and updated as  appropriate: past medical history, past surgical history, family history, social history, allergies, medications, and problem list.   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  Triggers are pollen allergies, cats, dogs, smoke   Blurry vision, right eye    in center of right eye last 2 weeks per pt on 09-03-2021   Closed bimalleolar fracture of left ankle 08/29/2021   Pain of joint of left ankle and foot 12/12/2021   Past Surgical History:  Procedure Laterality Date   EYE SURGERY Bilateral 2014   Detached retina on left with prophylactic laser surgery on the right as apparently at risk.   ORIF ANKLE FRACTURE Left 09/04/2021   Procedure: OPEN REDUCTION INTERNAL FIXATION (ORIF) ANKLE FRACTURE, SYNDESMOSIS FIXATION;  Surgeon: Barton Drape, MD;  Location: [facility] [place];  Service: Orthopedics;  Laterality: Left;   Family History  Problem Relation Age of Onset   Peptic Ulcer Mother        History of bleeding ulcer   Sickle cell trait Brother    Outpatient Medications Prior to Visit  Medication Sig Dispense Refill   albuterol  (ACCUNEB ) 0.63 MG/3ML nebulizer solution      amphetamine-dextroamphetamine (ADDERALL) 30 MG tablet Take 30 mg by mouth daily.     doxepin (SINEQUAN) 25 MG capsule Take 25 mg by mouth at bedtime.     ibuprofen  (ADVIL ) 800 MG tablet       ipratropium-albuterol  (DUONEB) 0.5-2.5 (3) MG/3ML SOLN Take 3 mLs by nebulization every 6 (six) hours as needed. At first, use 3 times daily for 3 days then every 6 hours as needed. 360 mL 1   albuterol  (VENTOLIN  HFA) 108 (90 Base) MCG/ACT inhaler Inhale 1-2 puffs into the lungs every 6 (six) hours as needed for wheezing or shortness of breath. 18 g 2   cyclobenzaprine  (FLEXERIL ) 5 MG tablet Take 1 tablet (5 mg total) by mouth every 8 (eight) hours as needed for muscle spasms. 30 tablet 0   fluticasone  furoate-vilanterol (BREO ELLIPTA ) 100-25 MCG/ACT AEPB Inhale 1 puff into the lungs daily. 60 each 3   No facility-administered medications prior to visit.   No Known Allergies   ROS: A complete ROS was performed with pertinent positives/negatives noted in the HPI. The remainder of the ROS are negative.    Objective:   Today's Vitals   06/28/24 1056  BP: (!) 128/92  Pulse: 86  SpO2: 97%  Weight: 226 lb (102.5 kg)  Height: 6' (1.829 m)    Physical Exam   GENERAL: Well-appearing, in NAD. Well nourished.  SKIN: Pink, warm and dry.  Head: Normocephalic. NECK: Trachea midline. Full ROM w/o pain or tenderness.  EYES: Conjunctiva clear without exudates. EOMI, PERRL, no drainage present.  Chalazion present to left upper eyelid. RESPIRATORY: Chest wall symmetrical. Respirations even and non-labored.  MSK: Muscle tone and strength appropriate for age. Joints w/o tenderness, redness, or swelling.  Mild lumbar tenderness present with movement, pain increasing with a right straight leg raise.  Full strength and full range of motion to all extremities present EXTREMITIES: Without clubbing, cyanosis, or edema.  NEUROLOGIC: No motor or sensory deficits. Steady, even gait. C2-C12 intact.  PSYCH/MENTAL STATUS: Alert, oriented x 3. Cooperative, appropriate mood and affect.     Assessment & Plan:  1. Chronic bilateral low back pain without sciatica (Primary) Discussed benefit of physical therapy with  patient as well as muscle relaxant and pain control.  Will prescribe limited amount of tramadol  and Robaxin  to use as needed for severe pain and muscle spasm.  Discussed safe use of this medication with patient and he verbalized understanding.  Referral placed to PT and recommend following up in 6 weeks with PCP for possible MRI. - Ambulatory referral to Physical Therapy  2. Chalazion left upper eyelid Patient to start using warm compresses 4 times daily for at least 2 weeks.  If no improvement or worsening, will reach out to PCP referral will be placed to ophthalmology.   Meds ordered this encounter  Medications   albuterol  (VENTOLIN  HFA) 108 (90 Base) MCG/ACT inhaler    Sig: Inhale 1-2 puffs into the lungs every 6 (six) hours as needed for wheezing or shortness of breath.    Dispense:  18 g    Refill:  2    Supervising Provider:   DE PERU, RAYMOND J [[id number]]   fluticasone  furoate-vilanterol (BREO ELLIPTA )  100-25 MCG/ACT AEPB    Sig: Inhale 1 puff into the lungs daily.    Dispense:  60 each    Refill:  3    Supervising Provider:   DE PERU, RAYMOND J [[id number]]   traMADol  (ULTRAM ) 50 MG tablet    Sig: Take 1 tablet (50 mg total) by mouth every 12 (twelve) hours as needed for severe pain (pain score 7-10).    Dispense:  30 tablet    Refill:  0    Supervising Provider:   DE PERU, RAYMOND J [[id number]]   methocarbamol  (ROBAXIN ) 500 MG tablet    Sig: Take 1 tablet (500 mg total) by mouth every 8 (eight) hours as needed for muscle spasms.    Dispense:  30 tablet    Refill:  0    Supervising Provider:   DE PERU, RAYMOND J [[id number]]   Lab Orders  No laboratory test(s) ordered today   Return in about 6 weeks (around 08/09/2024) for Follow up Chronic Back pain .    Patient to reach out to office if new, worrisome, or unresolved symptoms arise or if no improvement in patient's condition. Patient verbalized understanding and is agreeable to treatment plan. All questions answered to patient's  satisfaction.    Thersia Schuyler Stark, [place]

## 2024-07-13 ENCOUNTER — Ambulatory Visit

## 2024-07-13 NOTE — Therapy (Incomplete)
 OUTPATIENT PHYSICAL THERAPY THORACOLUMBAR EVALUATION   Patient Name: Michael Clayton. MRN: [id number] DOB:[birth year], 33 y.o., male Today's Date: 07/13/2024  END OF SESSION:   Past Medical History:  Diagnosis Date   ADHD (attention deficit hyperactivity disorder)    Ankle fracture, left    Anxiety    Asthma as a child   only using rescue inhaler.  Triggers are pollen allergies, cats, dogs, smoke   Blurry vision, right eye    in center of right eye last 2 weeks per pt on 09-03-2021   Closed bimalleolar fracture of left ankle 08/29/2021   Pain of joint of left ankle and foot 12/12/2021   Past Surgical History:  Procedure Laterality Date   EYE SURGERY Bilateral 2014   Detached retina on left with prophylactic laser surgery on the right as apparently at risk.   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   Left elbow tendinitis 12/16/2023   Chronic low back pain 12/16/2023   Arthralgia of left ankle 12/12/2021   Lumbar pain 06/21/2021   Moderate persistent asthma with exacerbation 04/20/2017   Environmental and seasonal allergies 04/20/2017   Acute asthma exacerbation 02/17/2017   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  ONSET DATE: ***  SUBJECTIVE:                                                                                                                                                                                            SUBJECTIVE STATEMENT: ***  PERTINENT HISTORY:  ***  PAIN:  Are you having pain?  Yes: NPRS scale: *** Pain location: *** Pain description: *** Aggravating factors: *** Relieving factors: ***  PRECAUTIONS: {Therapy precautions:24002}  RED FLAGS: {PT Red Flags:29287}   WEIGHT BEARING RESTRICTIONS: {Yes ***/No:24003}  FALLS:  Has patient fallen in last 6 months? {fallsyesno:27318}  LIVING ENVIRONMENT: Lives with: {[facility] lives with:25569::lives with their family} Lives in: {Lives in:25570} Stairs: {opstairs:27293} Has following equipment at home: {Assistive devices:23999}  OCCUPATION: ***  PLOF: {PLOF:24004}  PATIENT GOALS: ***  NEXT MD VISIT: ***  OBJECTIVE:  Note: Objective measures were completed at Evaluation unless otherwise noted.  DIAGNOSTIC FINDINGS:  ***  PATIENT SURVEYS:  {rehab surveys:24030}  COGNITION: Overall cognitive status: {cognition:24006}     SENSATION: {sensation:27233}  MUSCLE LENGTH: Hamstrings: Right *** deg; Left *** deg Debby test: Right *** deg; Left *** deg  POSTURE: {posture:25561}  PALPATION: ***  LUMBAR ROM:   AROM eval  Flexion   Extension   Right lateral flexion   Left lateral flexion   Right rotation   Left rotation    (Blank rows = not tested)  LOWER EXTREMITY ROM:     {AROM/PROM:27142}  Right eval Left eval  Hip flexion    Hip extension    Hip abduction    Hip adduction    Hip internal rotation    Hip external rotation    Knee flexion    Knee extension    Ankle dorsiflexion    Ankle plantarflexion    Ankle inversion    Ankle eversion     (Blank rows = not tested)  LOWER EXTREMITY MMT:    MMT Right eval Left eval  Hip flexion    Hip extension    Hip abduction    Hip adduction    Hip internal rotation    Hip external rotation    Knee flexion    Knee extension    Ankle dorsiflexion    Ankle plantarflexion     Ankle inversion    Ankle eversion     (Blank rows = not tested)  LUMBAR SPECIAL TESTS:  {lumbar special test:25242}  FUNCTIONAL TESTS:  {Functional tests:24029}  GAIT: Distance walked: *** Assistive device utilized: {Assistive devices:23999} Level of assistance: {Levels of assistance:24026} Comments: ***  TREATMENT: [facility] Adult PT Treatment:                                                DATE: *** Therapeutic Exercise: *** Manual Therapy: *** Neuromuscular re-ed: *** Therapeutic Activity: *** Modalities: *** Self Care: ***   PATIENT EDUCATION:  Education details: *** Person educated: {Person educated:25204} Education method: {Education Method:25205} Education comprehension: {Education Comprehension:25206}  HOME EXERCISE PROGRAM: ***  ASSESSMENT:  CLINICAL IMPRESSION: Patient is a *** y.o. *** who was seen today for physical therapy evaluation and treatment for ***.   OBJECTIVE IMPAIRMENTS: {opptimpairments:25111}.   ACTIVITY LIMITATIONS: {activitylimitations:27494}  PARTICIPATION LIMITATIONS: {participationrestrictions:25113}  PERSONAL FACTORS: {Personal factors:25162} are also affecting patient's functional outcome.   REHAB POTENTIAL: {rehabpotential:25112}  CLINICAL DECISION MAKING: {clinical decision making:25114}  EVALUATION COMPLEXITY: {Evaluation complexity:25115}   GOALS: Goals reviewed with patient? {yes/no:20286}  SHORT TERM GOALS: Target date: ***  *** Baseline: Goal status: INITIAL  2.  *** Baseline:  Goal status: INITIAL  LONG TERM GOALS: Target date: ***  *** Baseline:  Goal status: INITIAL  2.  *** Baseline:  Goal status: INITIAL  3.  *** Baseline:  Goal status: INITIAL  4.  *** Baseline:  Goal status: INITIAL  5.  *** Baseline:  Goal status: INITIAL  6.  *** Baseline:  Goal status: INITIAL  PLAN:  PT FREQUENCY: {rehab frequency:25116}  PT DURATION: {rehab duration:25117}  PLANNED INTERVENTIONS:  {rehab planned interventions:25118::97110-Therapeutic exercises,97530- Therapeutic [phone]- Neuromuscular re-education,97535- Self Rjmz,02859- Manual therapy}.  PLAN FOR NEXT SESSION: ***   Alm JAYSON Kingdom, PT 07/13/2024, 8:04 AM

## 2024-07-18 NOTE — Therapy (Unsigned)
 OUTPATIENT PHYSICAL THERAPY THORACOLUMBAR EVALUATION   Patient Name: Michael Clayton. MRN: [id number] DOB:17-Nov-1990, 33 y.o., male Today's Date: 07/19/2024  END OF SESSION:  PT End of Session - 07/19/24 1319     Visit Number 1    Number of Visits 12    Date for PT Re-Evaluation 09/18/24    Authorization Type MCD    PT Start Time 1215    PT Stop Time 1300    PT Time Calculation (min) 45 min    Activity Tolerance Patient tolerated treatment well    Behavior During Therapy WFL for tasks assessed/performed          Past Medical History:  Diagnosis Date   ADHD (attention deficit hyperactivity disorder)    Ankle fracture, left    Anxiety    Asthma as a child   only using rescue inhaler.  Triggers are pollen allergies, cats, dogs, smoke   Blurry vision, right eye    in center of right eye last 2 weeks per pt on 09-03-2021   Closed bimalleolar fracture of left ankle 08/29/2021   Pain of joint of left ankle and foot 12/12/2021   Past Surgical History:  Procedure Laterality Date   EYE SURGERY Bilateral 2014   Detached retina on left with prophylactic laser surgery on the right as apparently at risk.   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   Left elbow tendinitis 12/16/2023   Chronic low back pain 12/16/2023   Arthralgia of left ankle 12/12/2021   Lumbar pain 06/21/2021   Moderate persistent asthma with exacerbation 04/20/2017   Environmental and seasonal allergies 04/20/2017   Acute asthma exacerbation 02/17/2017   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 07/14/2013   Round hole of retina of right eye 07/14/2013    PCP: [name], FNP  REFERRING PROVIDER: [name], FNP  REFERRING DIAG: [phone] (ICD-10-CM) - Chronic bilateral low back pain without sciatica  Rationale for Evaluation and Treatment: Rehabilitation  THERAPY DIAG:  Other low back pain  Muscle weakness (generalized)  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  Is able to elicit a pop which does give him short term relief.  Has been receiving chiropractic care over the past year w/o lasting benefit.   Denied radicular symptoms.     Pt has been having pain in his back for about 4 months now which has been getting worse. Was trying to have MRI performed but insurance denied due to needing PT. Pt has been experiencing some numbnessw.  PERTINENT HISTORY:  Patient was seen by orthopedics on 11/18/2023 for acute right sided back pain with right-sided sciatica.  He was given conservative measures such as meloxicam , stretches, and lumbar x-ray performed at that time that showed a mild compression deformity at L5.  He saw PCP in February for ongoing pain that was not improving with chiropractic manipulation, yoga, and stretching.  He was recommended to start PT and referral was placed patient did not schedule.   Patient saw Dr. De Peru on 04/28/2024 and was interested in possible MRI.  He has been working with a [facility] and per chiropractor, he was not recommended to do PT.  He did not start PT per referral placed by PCP in February 2025.  MRI was placed at his order on 04/28/2024, but has been denied since patient has not performed conservative measures such as PT for at least 4 to 6 weeks.  At this time, patient is still having significant pain in the  lumbar area radiating down his bilateral lower extremities.  He is requesting PT referral at this time. He states pain worsens when lying flat and with movement. He has taken Aleve , Advil , ice, and positioning without improvement.  He denies paresthesia of groin, incontinence of urine, weakness, incontinence of stool.  PAIN:  Are you having pain? Yes: NPRS scale: 10/10 Pain location: Low back Pain description: ache, sharp, cramping Aggravating factors: everything Relieving factors: popping my back   PRECAUTIONS: None  RED FLAGS: None   WEIGHT BEARING RESTRICTIONS: No  FALLS:  Has patient fallen in last 6 months? No  OCCUPATION: not tworking  PLOF: Independent  PATIENT GOALS: To manage my back pain  NEXT MD VISIT: TBD  OBJECTIVE:  Note: Objective measures were completed at Evaluation unless otherwise noted.  DIAGNOSTIC FINDINGS:  FINDINGS: There is no evidence of lumbar spine fracture. Scoliosis. Minimal narrow intervertebral space at L5-S1. Minimal anterior spurring noted at L5 and S1.   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     Electronically Signed   By: [name] M.D.   On: 11/19/2023 08:56  PATIENT SURVEYS:  Modified Oswestry: 20/50 40% perceived disability  Interpretation of scores: Score Category Description  0-20% Minimal Disability The patient can cope with most living activities. Usually no treatment is indicated apart from advice on lifting, sitting and exercise  21-40% Moderate Disability The patient experiences more pain and difficulty with sitting, lifting and standing. Travel and social life are more difficult and they may be disabled from work. Personal care, sexual activity and sleeping are not grossly affected, and the patient can usually be managed by conservative means  41-60% Severe Disability Pain remains the main problem in this group, but activities of daily living are affected. These patients require a detailed  investigation  61-80% Crippled Back pain impinges on all aspects of the patient's life. Positive intervention is required  81-100% Bed-bound  These patients are either bed-bound or exaggerating their symptoms  Bluford BRAVO, Zoe DELENA Karon DELENA, et  al. Surgery versus conservative management of stable thoracolumbar fracture: the PRESTO feasibility RCT. [place] ([place]): [employer]; 2021 Nov. [facility] Technology Assessment, No. 25.62.) Appendix 3, Oswestry Disability Index category descriptors. Available from: FindJewel[employer].cz  Minimally Clinically Important Difference (MCID) = 12.8%  MUSCLE LENGTH: Hamstrings: Right 80 deg; Left 80 deg Thomas test: PKB negative B  POSTURE: increased lumbar lordosis  PALPATION: TTP B multifidus  LUMBAR ROM:   AROM eval  Flexion 90%(R rib hump)  Extension 90%  Right lateral flexion 90%  Left lateral flexion 90%  Right rotation 90%  Left rotation 90%   (Blank rows = not tested)  LOWER EXTREMITY ROM:   WNL  Active  Right eval Left eval  Hip flexion    Hip extension    Hip abduction    Hip adduction    Hip internal rotation    Hip external rotation    Knee flexion    Knee extension    Ankle dorsiflexion    Ankle plantarflexion    Ankle inversion    Ankle eversion     (Blank rows = not tested)  LOWER EXTREMITY MMT:    MMT Right eval Left eval  Hip flexion    Hip extension 4 4  Hip abduction    Hip adduction    Hip internal rotation    Hip external rotation    Knee flexion 4 4  Knee extension 4 4  Ankle dorsiflexion    Ankle plantarflexion    Ankle inversion    Ankle eversion     (Blank rows = not tested)  LUMBAR SPECIAL TESTS:  Straight leg raise test: Negative and Slump test: Negative  FUNCTIONAL TESTS:  30 seconds chair stand test 6 reps arms crossed  GAIT: Distance walked: 6ft x2 Assistive device utilized: None Level of assistance: Complete Independence Comments:  antalgic  TREATMENT:                                                                                                                             [facility] Adult PT Treatment:                                                DATE: 07/19/24 Eval and HEP Self Care: Additional minutes spent for educating on updated Therapeutic Home Exercise Program as well as comparing current status to condition at start of symptoms. This included exercises focusing on stretching, strengthening, with focus on eccentric aspects. Long term goals include an improvement in range of motion, strength, endurance as well as avoiding reinjury. Patient's frequency would include in 1-2 times a day, 3-5 times a week for a duration of 6-12 weeks. Proper technique shown and discussed handout in great detail. All questions were discussed and addressed.      PATIENT EDUCATION:  Education details: Discussed eval findings,  rehab rationale and POC and patient is in agreement  Person educated: Patient Education method: Explanation and Handouts Education comprehension: verbalized understanding and needs further education  HOME EXERCISE PROGRAM: Access Code: [email or web address] URL: [email or web address] Date: 07/19/2024 Prepared by: Reyes Kohut  Exercises - Supine 90/90 Abdominal Bracing  - 2 x daily - 7 x weekly - 1 sets - 2 reps - 60s hold - Static Prone on Elbows  - 2 x daily - 7 x weekly - 1 sets - 2 reps - hold - Seated Table Hamstring Stretch  - 2 x daily - 7 x weekly - 1 sets - 2 reps - 30s hold  ASSESSMENT:  CLINICAL IMPRESSION: Patient is a 33 y.o. male who was seen today for physical therapy evaluation and treatment for chronic low back pain. Patient presents with functional trunk ROM, abdominal weakness and hamstring tightness.  Palpation of B multifidi finds marked tone and tenderness and is most probably cause of persistent low back pain.  OBJECTIVE IMPAIRMENTS: decreased activity tolerance, decreased  knowledge of condition, decreased mobility, difficulty walking, decreased ROM, impaired perceived functional ability, increased muscle spasms, postural dysfunction, and pain.   ACTIVITY LIMITATIONS: lifting, bending, sitting, standing, squatting, and dressing  PERSONAL FACTORS: Age, Behavior pattern, Fitness, and Time since onset of injury/illness/exacerbation are also affecting patient's functional outcome.   REHAB POTENTIAL: Good  CLINICAL DECISION MAKING: Stable/uncomplicated  EVALUATION COMPLEXITY: Moderate   GOALS: Goals reviewed with patient? No  SHORT TERM GOALS: Target date: 08/09/2024  Patient to demonstrate independence in HEP  Baseline: [id number] Goal status: INITIAL    LONG TERM GOALS: Target date: 09/13/2024    Patient will increase 30s chair stand reps from 6 to 10 without arms to demonstrate and improved functional ability with less pain/difficulty as well as reduce fall risk.  Baseline: 6 Goal status: INITIAL  2.  Patient will score at least 28% on ODI to signify clinically meaningful improvement in functional abilities.   Baseline: 40% Goal status: INITIAL  3.  Patient will acknowledge 6/10 pain at least once during episode of care   Baseline: 10/10 Goal status: INITIAL  4.  Minimize B multifidus tone Baseline: marked tone  Goal status: INITIAL    PLAN:  PT FREQUENCY: 1-2x/week  PT DURATION: 6 weeks  PLANNED INTERVENTIONS: 97110-Therapeutic exercises, 97530- Therapeutic activity, [id number]- Neuromuscular re-education, 97535- Self Care, 02859- Manual therapy, 20560 (1-2 muscles), 20561 (3+ muscles)- Dry Needling, Patient/Family education, and Spinal mobilization.  PLAN FOR NEXT SESSION: HEP review and update, manual techniques as appropriate, aerobic tasks, ROM and flexibility activities, strengthening and PREs, TPDN, gait and balance training as needed    For all possible CPT codes, reference the Planned Interventions line above.     Check all  conditions that are expected to impact treatment: {Conditions expected to impact treatment:None of these apply   If treatment provided at initial evaluation, no treatment charged due to lack of authorization.       Clayson Riling M Saarah Dewing, PT 07/19/2024, 2:13 PM

## 2024-07-19 ENCOUNTER — Ambulatory Visit: Payer: Self-pay

## 2024-07-19 ENCOUNTER — Ambulatory Visit: Attending: Family Medicine

## 2024-07-19 DIAGNOSIS — M5459 Other low back pain: Secondary | ICD-10-CM | POA: Diagnosis present

## 2024-07-19 DIAGNOSIS — M6281 Muscle weakness (generalized): Secondary | ICD-10-CM | POA: Diagnosis present

## 2024-07-19 DIAGNOSIS — G8929 Other chronic pain: Secondary | ICD-10-CM | POA: Diagnosis not present

## 2024-07-19 DIAGNOSIS — M545 Low back pain, unspecified: Secondary | ICD-10-CM | POA: Insufficient documentation

## 2024-07-19 DIAGNOSIS — M6285 Dysfunction of the multifidus muscles, lumbar region: Secondary | ICD-10-CM | POA: Diagnosis present

## 2024-07-19 NOTE — Telephone Encounter (Signed)
 FYI Only or Action Required?: Action required by provider: update on patient condition. Patient is needing a prior authorization for tramadol  according to his pharmacy. Never received the order   Patient was last seen in primary care on 06/28/2024 by [name], FNP.  Called Nurse Triage reporting Back Pain.  Symptoms began several weeks ago.  Interventions attempted: Prescription medications: Robaxin  and Rest, hydration, or home remedies.  Symptoms are: unchanged.  Triage Disposition: See HCP Within 4 Hours (Or PCP Triage)  Patient/caregiver understands and will follow disposition?: No, wishes to speak with PCP  Copied from CRM #[id number]. Topic: Clinical - Red Word Triage >> Jun 27, 2024  8:12 AM Carlatta H wrote: Red Word that prompted transfer to Nurse Triage: Severe back pain that has gotten worse//Patient also has eye swelling >> Jul 19, 2024 12:12 PM Montie POUR wrote: Michael Clayton is calling back to get an update on prior authorization for raMADol (ULTRAM ) 50 MG tablet. He has never been able to get medication.  His pain in his back is at a 10. He had an office visit on 06/28/24 Reason for Disposition  [1] SEVERE back pain (e.g., excruciating, unable to do any normal activities) AND [2] not improved 2 hours after pain medicine  Answer Assessment - Initial Assessment Questions 1. ONSET: When did the pain begin? (e.g., minutes, hours, days)     Three weeks 2. LOCATION: Where does it hurt? (upper, mid or lower back)     Lower back  3. SEVERITY: How bad is the pain?  (e.g., Scale 1-10; mild, moderate, or severe)     8-9 out of 10 4. PATTERN: Is the pain constant? (e.g., yes, no; constant, intermittent)      constant 5. RADIATION: Does the pain shoot into your legs or somewhere else?     Radiates into legs 6. CAUSE:  What do you think is causing the back pain?      unsure 7. BACK OVERUSE:  Any recent lifting of heavy objects, strenuous work or exercise?      no 8. MEDICINES: What have you taken so far for the pain? (e.g., nothing, acetaminophen , NSAIDS)     Robaxin -patient hasn't been able to get Tramadol -needs a prior authorization 9. NEUROLOGIC SYMPTOMS: Do you have any weakness, numbness, or problems with bowel/bladder control?     no 10. OTHER SYMPTOMS: Do you have any other symptoms? (e.g., fever, abdomen pain, burning with urination, blood in urine)       No  Patient is currently in PT-patient reports he was told by pharmacy that he needs a prior authorization to get the Tramadol .  Protocols used: Back Pain-A-AH

## 2024-07-19 NOTE — Telephone Encounter (Signed)
 Prior authorization for tramadol  was initiated 06/28/24 and was approved. Showing that it was approved effective 07/01/24-12/28/24. Called and spoke with pt letting him know this info and stated to him that the pharmacy should be able to run Rx through insurance. Stated to pt if he still ran into any issues to let us  know and he verbalized understanding.

## 2024-07-25 ENCOUNTER — Encounter: Admitting: Physical Therapy

## 2024-07-25 ENCOUNTER — Telehealth (HOSPITAL_BASED_OUTPATIENT_CLINIC_OR_DEPARTMENT_OTHER): Payer: Self-pay | Admitting: *Deleted

## 2024-07-25 NOTE — Therapy (Incomplete)
 OUTPATIENT PHYSICAL THERAPY THORACOLUMBAR EVALUATION   Patient Name: Michael Clayton. MRN: [id number] DOB:1990/12/27, 33 y.o., male Today's Date: 07/25/2024  END OF SESSION:    Past Medical History:  Diagnosis Date   ADHD (attention deficit hyperactivity disorder)    Ankle fracture, left    Anxiety    Asthma as a child   only using rescue inhaler.  Triggers are pollen allergies, cats, dogs, smoke   Blurry vision, right eye    in center of right eye last 2 weeks per pt on 09-03-2021   Closed bimalleolar fracture of left ankle 08/29/2021   Pain of joint of left ankle and foot 12/12/2021   Past Surgical History:  Procedure Laterality Date   EYE SURGERY Bilateral 2014   Detached retina on left with prophylactic laser surgery on the right as apparently at risk.   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   Left elbow tendinitis 12/16/2023   Chronic low back pain 12/16/2023   Arthralgia of left ankle 12/12/2021   Lumbar pain 06/21/2021   Moderate persistent asthma with exacerbation 04/20/2017   Environmental and seasonal allergies 04/20/2017   Acute asthma exacerbation 02/17/2017   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  ONSET DATE: chronic  SUBJECTIVE:                                                                                                                                                                                            SUBJECTIVE STATEMENT: Patient presents to [facility] with chronic low back pain ongoing for ~6 months.  Is able to elicit a pop which does give him short term relief.  Has been receiving chiropractic care over the past year w/o lasting benefit.   Denied radicular symptoms.     Pt has been having pain in his back for about 4 months now which has been getting worse. Was trying to have MRI performed but insurance denied due to needing PT. Pt has been experiencing some numbnessw.  PERTINENT HISTORY:  Patient was seen by orthopedics on 11/18/2023 for acute right  sided back pain with right-sided sciatica.  He was given conservative measures such as meloxicam , stretches, and lumbar x-ray performed at that time that showed a mild compression deformity at L5.  He saw PCP in February for ongoing pain that was not improving with chiropractic manipulation, yoga, and stretching.  He was recommended to start PT and referral was placed patient did not schedule.   Patient saw Dr. De Peru on 04/28/2024 and was interested in possible MRI.  He has been working with a [facility] and per chiropractor, he was not recommended to do PT.  He did not start PT per referral placed by PCP in February 2025.  MRI was placed at his order on 04/28/2024, but has been denied since patient has not performed conservative measures such as PT for at least 4 to 6 weeks.  At this time, patient is still having significant pain in the lumbar area radiating down his bilateral lower extremities.  He is requesting PT referral at this time. He states pain worsens when lying flat and with movement. He has taken Aleve , Advil , ice, and positioning without improvement.  He denies paresthesia of groin, incontinence of urine, weakness, incontinence of stool.  PAIN:  Are you having pain? Yes: NPRS scale: 10/10 Pain location: Low back Pain description: ache, sharp,  cramping Aggravating factors: everything Relieving factors: popping my back   PRECAUTIONS: None  RED FLAGS: None   WEIGHT BEARING RESTRICTIONS: No  FALLS:  Has patient fallen in last 6 months? No  OCCUPATION: not tworking  PLOF: Independent  PATIENT GOALS: To manage my back pain  NEXT MD VISIT: TBD  OBJECTIVE:  Note: Objective measures were completed at Evaluation unless otherwise noted.  DIAGNOSTIC FINDINGS:  FINDINGS: There is no evidence of lumbar spine fracture. Scoliosis. Minimal narrow intervertebral space at L5-S1. Minimal anterior spurring noted at L5 and S1.   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     Electronically Signed   By: [name] M.D.   On: 11/19/2023 08:56  PATIENT SURVEYS:  Modified Oswestry: 20/50 40% perceived disability  Interpretation of scores: Score Category Description  0-20% Minimal Disability The patient can cope with most living activities. Usually no treatment is indicated apart from advice on lifting, sitting and exercise  21-40% Moderate Disability The patient experiences more pain and difficulty with sitting, lifting and standing. Travel and social life are more difficult and they may be disabled from work. Personal care, sexual activity and sleeping are not grossly affected, and the patient can usually be managed by conservative means  41-60% Severe Disability Pain remains the main problem in this group, but activities of daily living are affected. These patients require a detailed investigation  61-80% Crippled Back pain impinges on all aspects of the patient's life. Positive intervention is required  81-100% Bed-bound  These patients are either bed-bound or exaggerating their symptoms  Bluford FORBES Zoe DELENA Karon DELENA, et al. Surgery versus conservative management of stable thoracolumbar fracture: the PRESTO feasibility RCT. [place]): [employer]; 2021 Nov. [facility] Technology Assessment,  No. 25.62.) Appendix 3, Oswestry Disability Index category descriptors. Available from: FindJewelers.cz  Minimally Clinically Important Difference (MCID) = 12.8%  MUSCLE LENGTH: Hamstrings: Right 80 deg; Left 80 deg Thomas test: PKB negative B  POSTURE: increased lumbar lordosis  PALPATION: TTP B multifidus  LUMBAR ROM:   AROM eval  Flexion 90%(R rib hump)  Extension 90%  Right lateral flexion 90%  Left lateral flexion 90%  Right rotation 90%  Left rotation 90%   (Blank rows = not tested)  LOWER EXTREMITY ROM:   WNL  Active  Right eval Left eval  Hip flexion    Hip extension    Hip abduction    Hip adduction    Hip internal rotation    Hip external rotation    Knee flexion    Knee extension    Ankle dorsiflexion    Ankle plantarflexion    Ankle inversion    Ankle eversion     (Blank rows = not tested)  LOWER EXTREMITY MMT:    MMT Right eval Left eval  Hip flexion    Hip extension 4 4  Hip abduction    Hip adduction    Hip internal rotation    Hip external rotation    Knee flexion 4 4  Knee extension 4 4  Ankle dorsiflexion    Ankle plantarflexion    Ankle inversion    Ankle eversion     (Blank rows = not tested)  LUMBAR SPECIAL TESTS:  Straight leg raise test: Negative and Slump test: Negative  FUNCTIONAL TESTS:  30 seconds chair stand test 6 reps arms crossed  GAIT: Distance walked: 87ft x2 Assistive device utilized: None Level of assistance: Complete Independence Comments: antalgic  TREATMENT:                                                                                                                             [facility] Adult PT Treatment:                                                DATE: 07/19/24 Eval and HEP Self Care: Additional minutes spent for educating on updated Therapeutic Home Exercise Program as well as comparing current status to condition at start of symptoms. This included exercises focusing  on stretching, strengthening, with focus on eccentric aspects. Long term goals include an improvement in range of motion, strength, endurance as well as avoiding reinjury. Patient's frequency would include in 1-2 times a day, 3-5 times a week for a duration of 6-12 weeks. Proper technique shown and discussed handout in great detail. All questions were discussed and addressed.      PATIENT EDUCATION:  Education details: Discussed eval findings, rehab rationale and POC and patient is in agreement  Person educated: Patient Education method: Explanation and Handouts Education comprehension: verbalized understanding and needs further education  HOME EXERCISE PROGRAM: Access Code: [email or web address] URL: [email or web address] Date: 07/19/2024 Prepared by: Reyes Kohut  Exercises - Supine 90/90 Abdominal Bracing  - 2 x daily - 7 x weekly - 1 sets - 2 reps - 60s hold - Static Prone on Elbows  - 2 x daily - 7 x weekly - 1 sets - 2 reps - hold - Seated Table Hamstring Stretch  - 2 x  daily - 7 x weekly - 1 sets - 2 reps - 30s hold  ASSESSMENT:  CLINICAL IMPRESSION: Patient is a 33 y.o. male who was seen today for physical therapy evaluation and treatment for chronic low back pain. Patient presents with functional trunk ROM, abdominal weakness and hamstring tightness.  Palpation of B multifidi finds marked tone and tenderness and is most probably cause of persistent low back pain.  OBJECTIVE IMPAIRMENTS: decreased activity tolerance, decreased knowledge of condition, decreased mobility, difficulty walking, decreased ROM, impaired perceived functional ability, increased muscle spasms, postural dysfunction, and pain.   ACTIVITY LIMITATIONS: lifting, bending, sitting, standing, squatting, and dressing  PERSONAL FACTORS: Age, Behavior pattern, Fitness, and Time since onset of injury/illness/exacerbation are also affecting patient's functional outcome.   REHAB POTENTIAL: Good  CLINICAL  DECISION MAKING: Stable/uncomplicated  EVALUATION COMPLEXITY: Moderate   GOALS: Goals reviewed with patient? No  SHORT TERM GOALS: Target date: 08/09/2024  Patient to demonstrate independence in HEP  Baseline: [id number] Goal status: INITIAL    LONG TERM GOALS: Target date: 09/13/2024    Patient will increase 30s chair stand reps from 6 to 10 without arms to demonstrate and improved functional ability with less pain/difficulty as well as reduce fall risk.  Baseline: 6 Goal status: INITIAL  2.  Patient will score at least 28% on ODI to signify clinically meaningful improvement in functional abilities.   Baseline: 40% Goal status: INITIAL  3.  Patient will acknowledge 6/10 pain at least once during episode of care   Baseline: 10/10 Goal status: INITIAL  4.  Minimize B multifidus tone Baseline: marked tone  Goal status: INITIAL    PLAN:  PT FREQUENCY: 1-2x/week  PT DURATION: 6 weeks  PLANNED INTERVENTIONS: 97110-Therapeutic exercises, 97530- Therapeutic activity, [id number]- Neuromuscular re-education, 97535- Self Care, 02859- Manual therapy, 20560 (1-2 muscles), 20561 (3+ muscles)- Dry Needling, Patient/Family education, and Spinal mobilization.  PLAN FOR NEXT SESSION: HEP review and update, manual techniques as appropriate, aerobic tasks, ROM and flexibility activities, strengthening and PREs, TPDN, gait and balance training as needed    For all possible CPT codes, reference the Planned Interventions line above.     Check all conditions that are expected to impact treatment: {Conditions expected to impact treatment:None of these apply   If treatment provided at initial evaluation, no treatment charged due to lack of authorization.       Mabel Kiang, PT, DPT 07/25/2024, 11:28 AM

## 2024-07-25 NOTE — Telephone Encounter (Signed)
 Mychart sent to pt of the info from [facility].

## 2024-07-25 NOTE — Telephone Encounter (Signed)
Routing to Amanda for review.

## 2024-07-25 NOTE — Telephone Encounter (Signed)
 Pt called back and is requesting that his pain medication called in now. Please advise

## 2024-07-25 NOTE — Telephone Encounter (Signed)
 Copied from CRM #[id number]. Topic: Clinical - Medication Question >> Jul 25, 2024 11:52 AM Anairis L wrote: Reason for CRM: Patient is requesting something a bit stronger then traMADol  (ULTRAM ) 50 MG tablet, he is still in pain after taking medication.    Please call patient with update.   [id number] [address], [place] - [address] AT [facility] [address] [place] [address] Phone: [phone] Fax: [phone]

## 2024-07-27 ENCOUNTER — Telehealth (HOSPITAL_BASED_OUTPATIENT_CLINIC_OR_DEPARTMENT_OTHER): Payer: Self-pay | Admitting: *Deleted

## 2024-07-27 NOTE — Telephone Encounter (Signed)
 Copied from CRM #[id number]. Topic: Referral - Request for Referral >> Jul 27, 2024  2:44 PM Delon DASEN wrote: Did the patient discuss referral with their provider in the last year? No (If No - schedule appointment) (If Yes - send message)  Appointment offered? No  Type of order/referral and detailed reason for visit: pain management  Preference of office, provider, location: local if possible  If referral order, have you been seen by this specialty before? No (If Yes, this issue or another issue? When? Where?  Can we respond through MyChart? Yes

## 2024-07-27 NOTE — Telephone Encounter (Signed)
 Thersia, please advise on referral requested.

## 2024-07-28 ENCOUNTER — Other Ambulatory Visit (HOSPITAL_BASED_OUTPATIENT_CLINIC_OR_DEPARTMENT_OTHER): Payer: Self-pay | Admitting: Family Medicine

## 2024-07-28 DIAGNOSIS — G8929 Other chronic pain: Secondary | ICD-10-CM

## 2024-07-28 NOTE — Telephone Encounter (Signed)
 Spoke with patient to let know pain management referral placed and they should contact him with verbal understanding

## 2024-08-03 ENCOUNTER — Ambulatory Visit: Attending: Family Medicine | Admitting: Physical Therapy

## 2024-08-03 NOTE — Therapy (Deleted)
 OUTPATIENT PHYSICAL THERAPY THORACOLUMBAR EVALUATION   Patient Name: Michael Clayton. MRN: [id number] DOB:Sep 30, 1991, 33 y.o., male Today's Date: 08/03/2024  END OF SESSION:    Past Medical History:  Diagnosis Date   ADHD (attention deficit hyperactivity disorder)    Ankle fracture, left    Anxiety    Asthma as a child   only using rescue inhaler.  Triggers are pollen allergies, cats, dogs, smoke   Blurry vision, right eye    in center of right eye last 2 weeks per pt on 09-03-2021   Closed bimalleolar fracture of left ankle 08/29/2021   Pain of joint of left ankle and foot 12/12/2021   Past Surgical History:  Procedure Laterality Date   EYE SURGERY Bilateral 2014   Detached retina on left with prophylactic laser surgery on the right as apparently at risk.   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  ONSET DATE: chronic  SUBJECTIVE:                                                                                                                                                                                            SUBJECTIVE STATEMENT: Patient presents to [facility] with chronic low back pain ongoing for ~6 months.  Is able to elicit a pop which does give him short term relief.  Has been receiving chiropractic care over the past year w/o lasting benefit.   Denied radicular symptoms.     Pt has been having pain in his back for about 4 months now which has been getting worse. Was trying to have MRI performed but insurance denied due to needing PT. Pt has been experiencing some numbnessw.  PERTINENT HISTORY:  Patient was seen by orthopedics on 11/18/2023 for acute right  sided back pain with right-sided sciatica.  He was given conservative measures such as meloxicam , stretches, and lumbar x-ray performed at that time that showed a mild compression deformity at L5.  He saw PCP in February for ongoing pain that was not improving with chiropractic manipulation, yoga, and stretching.  He was recommended to start PT and referral was placed patient did not schedule.   Patient saw Dr. De Peru on 04/28/2024 and was interested in possible MRI.  He has been working with a [facility] and per chiropractor, he was not recommended to do PT.  He did not start PT per referral placed by PCP in February 2025.  MRI was placed at his order on 04/28/2024, but has been denied since patient has not performed conservative measures such as PT for at least 4 to 6 weeks.  At this time, patient is still having significant pain in the lumbar area radiating down his bilateral lower extremities.  He is requesting PT referral at this time. He states pain worsens when lying flat and with movement. He has taken Aleve , Advil , ice, and positioning without improvement.  He denies paresthesia of groin, incontinence of urine, weakness, incontinence of stool.  PAIN:  Are you having pain? Yes: NPRS scale: 10/10 Pain location: Low back Pain description: ache, sharp,  cramping Aggravating factors: everything Relieving factors: popping my back   PRECAUTIONS: None  RED FLAGS: None   WEIGHT BEARING RESTRICTIONS: No  FALLS:  Has patient fallen in last 6 months? No  OCCUPATION: not tworking  PLOF: Independent  PATIENT GOALS: To manage my back pain  NEXT MD VISIT: TBD  OBJECTIVE:  Note: Objective measures were completed at Evaluation unless otherwise noted.  DIAGNOSTIC FINDINGS:  FINDINGS: There is no evidence of lumbar spine fracture. Scoliosis. Minimal narrow intervertebral space at L5-S1. Minimal anterior spurring noted at L5 and S1.   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     Electronically Signed   By: [name] M.D.   On: 11/19/2023 08:56  PATIENT SURVEYS:  Modified Oswestry: 20/50 40% perceived disability  Interpretation of scores: Score Category Description  0-20% Minimal Disability The patient can cope with most living activities. Usually no treatment is indicated apart from advice on lifting, sitting and exercise  21-40% Moderate Disability The patient experiences more pain and difficulty with sitting, lifting and standing. Travel and social life are more difficult and they may be disabled from work. Personal care, sexual activity and sleeping are not grossly affected, and the patient can usually be managed by conservative means  41-60% Severe Disability Pain remains the main problem in this group, but activities of daily living are affected. These patients require a detailed investigation  61-80% Crippled Back pain impinges on all aspects of the patient's life. Positive intervention is required  81-100% Bed-bound  These patients are either bed-bound or exaggerating their symptoms  Bluford FORBES Zoe DELENA Karon DELENA, et al. Surgery versus conservative management of stable thoracolumbar fracture: the PRESTO feasibility RCT. [place] [employer]; 2021 Nov. [facility] Technology Assessment,  No. 25.62.) Appendix 3, Oswestry Disability Index category descriptors. Available from: FindJewelers.cz  Minimally Clinically Important Difference (MCID) = 12.8%  MUSCLE LENGTH: Hamstrings: Right 80 deg; Left 80 deg Thomas test: PKB negative B  POSTURE: increased lumbar lordosis  PALPATION: TTP B multifidus  LUMBAR ROM:   AROM eval  Flexion 90%(R rib hump)  Extension 90%  Right lateral flexion 90%  Left lateral flexion 90%  Right rotation 90%  Left rotation 90%   (Blank rows = not tested)  LOWER EXTREMITY ROM:   WNL  Active  Right eval Left eval  Hip flexion    Hip extension    Hip abduction    Hip adduction    Hip internal rotation    Hip external rotation    Knee flexion    Knee extension    Ankle dorsiflexion    Ankle plantarflexion    Ankle inversion    Ankle eversion     (Blank rows = not tested)  LOWER EXTREMITY MMT:    MMT Right eval Left eval  Hip flexion    Hip extension 4 4  Hip abduction    Hip adduction    Hip internal rotation    Hip external rotation    Knee flexion 4 4  Knee extension 4 4  Ankle dorsiflexion    Ankle plantarflexion    Ankle inversion    Ankle eversion     (Blank rows = not tested)  LUMBAR SPECIAL TESTS:  Straight leg raise test: Negative and Slump test: Negative  FUNCTIONAL TESTS:  30 seconds chair stand test 6 reps arms crossed  GAIT: Distance walked: 76ft x2 Assistive device utilized: None Level of assistance: Complete Independence Comments: antalgic  TREATMENT:                                                                                                                             [facility] Adult PT Treatment:                                                DATE: 07/19/24 Eval and HEP Self Care: Additional minutes spent for educating on updated Therapeutic Home Exercise Program as well as comparing current status to condition at start of symptoms. This included exercises focusing  on stretching, strengthening, with focus on eccentric aspects. Long term goals include an improvement in range of motion, strength, endurance as well as avoiding reinjury. Patient's frequency would include in 1-2 times a day, 3-5 times a week for a duration of 6-12 weeks. Proper technique shown and discussed handout in great detail. All questions were discussed and addressed.      PATIENT EDUCATION:  Education details: Discussed eval findings, rehab rationale and POC and patient is in agreement  Person educated: Patient Education method: Explanation and Handouts Education comprehension: verbalized understanding and needs further education  HOME EXERCISE PROGRAM: Access Code: [email or web address] URL: [email or web address] Date: 07/19/2024 Prepared by: Reyes Kohut  Exercises - Supine 90/90 Abdominal Bracing  - 2 x daily - 7 x weekly - 1 sets - 2 reps - 60s hold - Static Prone on Elbows  - 2 x daily - 7 x weekly - 1 sets - 2 reps - hold - Seated Table Hamstring Stretch  - 2 x  daily - 7 x weekly - 1 sets - 2 reps - 30s hold  ASSESSMENT:  CLINICAL IMPRESSION: Patient is a 33 y.o. male who was seen today for physical therapy evaluation and treatment for chronic low back pain. Patient presents with functional trunk ROM, abdominal weakness and hamstring tightness.  Palpation of B multifidi finds marked tone and tenderness and is most probably cause of persistent low back pain.  OBJECTIVE IMPAIRMENTS: decreased activity tolerance, decreased knowledge of condition, decreased mobility, difficulty walking, decreased ROM, impaired perceived functional ability, increased muscle spasms, postural dysfunction, and pain.   ACTIVITY LIMITATIONS: lifting, bending, sitting, standing, squatting, and dressing  PERSONAL FACTORS: Age, Behavior pattern, Fitness, and Time since onset of injury/illness/exacerbation are also affecting patient's functional outcome.   REHAB POTENTIAL: Good  CLINICAL  DECISION MAKING: Stable/uncomplicated  EVALUATION COMPLEXITY: Moderate   GOALS: Goals reviewed with patient? No  SHORT TERM GOALS: Target date: 08/09/2024  Patient to demonstrate independence in HEP  Baseline: [id number] Goal status: INITIAL    LONG TERM GOALS: Target date: 09/13/2024    Patient will increase 30s chair stand reps from 6 to 10 without arms to demonstrate and improved functional ability with less pain/difficulty as well as reduce fall risk.  Baseline: 6 Goal status: INITIAL  2.  Patient will score at least 28% on ODI to signify clinically meaningful improvement in functional abilities.   Baseline: 40% Goal status: INITIAL  3.  Patient will acknowledge 6/10 pain at least once during episode of care   Baseline: 10/10 Goal status: INITIAL  4.  Minimize B multifidus tone Baseline: marked tone  Goal status: INITIAL    PLAN:  PT FREQUENCY: 1-2x/week  PT DURATION: 6 weeks  PLANNED INTERVENTIONS: 97110-Therapeutic exercises, 97530- Therapeutic activity, [id number]- Neuromuscular re-education, 97535- Self Care, 02859- Manual therapy, 20560 (1-2 muscles), 20561 (3+ muscles)- Dry Needling, Patient/Family education, and Spinal mobilization.  PLAN FOR NEXT SESSION: HEP review and update, manual techniques as appropriate, aerobic tasks, ROM and flexibility activities, strengthening and PREs, TPDN, gait and balance training as needed    For all possible CPT codes, reference the Planned Interventions line above.     Check all conditions that are expected to impact treatment: {Conditions expected to impact treatment:None of these apply   If treatment provided at initial evaluation, no treatment charged due to lack of authorization.       Mabel Kiang, PT, DPT 08/03/2024, 10:24 AM

## 2024-08-05 ENCOUNTER — Ambulatory Visit

## 2024-08-10 ENCOUNTER — Ambulatory Visit

## 2024-08-12 ENCOUNTER — Encounter

## 2024-08-16 ENCOUNTER — Ambulatory Visit

## 2024-08-19 ENCOUNTER — Encounter

## 2024-08-21 ENCOUNTER — Ambulatory Visit
Admission: EM | Admit: 2024-08-21 | Discharge: 2024-08-21 | Disposition: A | Discharge status: Home / Self Care | Attending: Family Medicine | Admitting: Family Medicine

## 2024-08-21 DIAGNOSIS — S46819A Strain of other muscles, fascia and tendons at shoulder and upper arm level, unspecified arm, initial encounter: Secondary | ICD-10-CM | POA: Diagnosis not present

## 2024-08-21 DIAGNOSIS — S161XXA Strain of muscle, fascia and tendon at neck level, initial encounter: Secondary | ICD-10-CM

## 2024-08-21 MED ORDER — METHOCARBAMOL 500 MG PO TABS: 500.0000 mg | ORAL_TABLET | Freq: Two times a day (BID) | ORAL | 0 refills | Status: AC | PRN
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
## 2024-08-21 NOTE — Discharge Instructions (Signed)
 Start Robaxin  ice daily as needed for your neck and shoulder pain.  Please note this medication will make you drowsy.  Do not drink alcohol or drive while on this medication.  Continue ibuprofen  over-the-counter as needed.  Heat to the neck and shoulder.  Lots of rest.  Please follow-up with your PCP if your symptoms do not improve.  Please go to the ER for any worsening symptoms.  Hope you feel better soon!
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
## 2024-09-05 ENCOUNTER — Encounter: Payer: Self-pay | Admitting: Radiology

## 2024-09-06 ENCOUNTER — Encounter: Payer: Self-pay | Admitting: Emergency Medicine

## 2024-09-06 ENCOUNTER — Ambulatory Visit
Admission: EM | Admit: 2024-09-06 | Discharge: 2024-09-06 | Disposition: A | Discharge status: Home / Self Care | Attending: Emergency Medicine | Admitting: Emergency Medicine

## 2024-09-06 DIAGNOSIS — N4889 Other specified disorders of penis: Secondary | ICD-10-CM

## 2024-09-06 NOTE — ED Provider Notes (Signed)
 [facility]    CSN: [id number] Arrival date & time: 09/06/24  1403      History   Chief Complaint No chief complaint on file.   HPI Michael Clayton. is a 33 y.o. male. Pt with penile irrtation for the last month, worse during intercourse. Denies penile discharge or lesion, no concern for sti (has 1 partner, declines testing). Irritation was pain last night with intercourse. Feels irriated area looks red after intercourse but not before intercourse on his erect or flaccid penis. No fever, chills, abd pain, dysuria.   Review of records shows urgent care visit for Fordyce spots on penis 11/11/23.   HPI  Past Medical History:  Diagnosis Date   ADHD (attention deficit hyperactivity disorder)    Ankle fracture, left    Anxiety    Asthma as a child   only using rescue inhaler.  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   ORIF ANKLE FRACTURE Left 09/04/2021   Procedure: OPEN REDUCTION INTERNAL FIXATION (ORIF) ANKLE FRACTURE, SYNDESMOSIS FIXATION;  Surgeon: Barton Drape, MD;  Location: [facility];  Service:  Orthopedics;  Laterality: Left;       Home Medications    Prior to Admission medications   Medication Sig Start Date End Date Taking? Authorizing Provider  albuterol  (ACCUNEB ) 0.63 MG/3ML nebulizer solution     [provider]  albuterol  (VENTOLIN  HFA) 108 (90 Base) MCG/ACT inhaler Inhale 1-2 puffs into the lungs every 6 (six) hours as needed for wheezing or shortness of breath. 06/28/24   Caudle, Thersia Bitters, FNP  amphetamine-dextroamphetamine (ADDERALL) 30 MG tablet Take 30 mg by mouth daily.    [provider]  doxepin (SINEQUAN) 25 MG capsule Take 25 mg by mouth at bedtime. 11/11/23   [provider]  fluticasone  furoate-vilanterol (BREO ELLIPTA ) 100-25 MCG/ACT AEPB Inhale 1 puff into the lungs daily. 06/28/24   Caudle, Thersia Bitters, FNP  ibuprofen  (ADVIL ) 800 MG tablet     [provider]  ipratropium-albuterol  (DUONEB) 0.5-2.5 (3) MG/3ML SOLN Take 3 mLs by nebulization every 6 (six) hours as needed. At first, use 3 times daily for 3 days then every 6 hours as needed. 07/28/23   Towana Small, FNP  methocarbamol  (ROBAXIN ) 500 MG tablet Take 1 tablet (500 mg total) by mouth 2 (two) times daily as needed for muscle spasms. 08/21/24   Mayer, Jodi R, NP  traMADol  (ULTRAM ) 50 MG tablet Take 1 tablet (50 mg total) by mouth every 12 (twelve) hours as needed for severe pain (pain score 7-10). 06/28/24   Caudle, Thersia Bitters,  FNP    Family History Family History  Problem Relation Age of Onset   Peptic Ulcer Mother        History of bleeding ulcer   Sickle cell trait Brother     Social History Social History   Tobacco Use   Smoking status: Former    Current packs/day: 0.00    Average packs/day: 0.2 packs/day for 1 year (0.2 ttl pk-yrs)    Types: Cigarettes    Start date: 05/23/2007    Quit date: 05/22/2008    Years since quitting: 16.3   Smokeless tobacco: Never  Vaping Use   Vaping status: Never Used  Substance Use Topics   Alcohol use: Not  Currently    Alcohol/week: 2.0 standard drinks of alcohol    Types: 2 Standard drinks or equivalent per week   Drug use: No     Allergies   Patient has no known allergies.   Review of Systems Review of Systems   Physical Exam Triage Vital Signs ED Triage Vitals  Encounter Vitals Group     BP 09/06/24 1420 123/84     Girls Systolic BP Percentile --      Girls Diastolic BP Percentile --      Boys Systolic BP Percentile --      Boys Diastolic BP Percentile --      Pulse Rate 09/06/24 1420 96     Resp 09/06/24 1420 15     Temp 09/06/24 1420 97.6 F (36.4 C)     Temp Source 09/06/24 1420 Oral     SpO2 09/06/24 1420 96 %     Weight --      Height --      Head Circumference --      Peak Flow --      Pain Score 09/06/24 1426 3     Pain Loc --      Pain Education --      Exclude from Growth Chart --    No data found.  Updated Vital Signs BP 123/84 (BP Location: Right Arm)   Pulse 96   Temp 97.6 F (36.4 C) (Oral)   Resp 15   SpO2 96%   Visual Acuity Right Eye Distance:   Left Eye Distance:   Bilateral Distance:    Right Eye Near:   Left Eye Near:    Bilateral Near:     Physical Exam Exam conducted with a chaperone present.  Constitutional:      Appearance: Normal appearance.  Pulmonary:     Effort: Pulmonary effort is normal.  Genitourinary:    Penis: Circumcised. No tenderness, discharge or swelling.     Neurological:     Mental Status: He is alert.      UC Treatments / Results  Labs (all labs ordered are listed, but only abnormal results are displayed) Labs Reviewed - No data to display  EKG   Radiology No results found.  Procedures Procedures (including critical care time)  Medications Ordered in UC Medications - No data to display  Initial Impression / Assessment and Plan / UC Course  I have reviewed the triage vital signs and the nursing notes.  Pertinent labs & imaging results that were available during my care of the patient  were reviewed by me and considered in my medical decision making (see chart for details).     I think small skin tear indicates need for increased lubrication.   I think prior fordyce spots have resolved. There is one tiny  papule that may be one tiny residual spot.   I do not see concern on penis in area of irritation concern. I rec lube during intercourse to see if that relieves red/irritated area he sees after intercourse. If this does not solve problem, f/u with urol.   Final Clinical Impressions(s) / UC Diagnoses   Final diagnoses:  Penile irritation     Discharge Instructions      Use lube during intercourse - KY jelly, astroglide, etc - whatever you prefer.  If you continue to have trouble with this irritated area, see a urologist.    ED Prescriptions   None    PDMP not reviewed this encounter.   Richad Jon HERO, NP 09/06/24 1520

## 2024-09-06 NOTE — Discharge Instructions (Signed)
 Use lube during intercourse - KY jelly, astroglide, etc - whatever you prefer.  If you continue to have trouble with this irritated area, see a urologist.

## 2024-09-06 NOTE — ED Triage Notes (Signed)
 Pt c/o dry spots on penis for almost 1 year. He was seen at [facility] on 1/8 and dx with fordyce spots.  States he has started to have more discomfort with intercourse over last month.

## 2024-09-28 ENCOUNTER — Encounter (HOSPITAL_BASED_OUTPATIENT_CLINIC_OR_DEPARTMENT_OTHER): Payer: Self-pay | Admitting: Emergency Medicine

## 2024-09-28 ENCOUNTER — Emergency Department (HOSPITAL_BASED_OUTPATIENT_CLINIC_OR_DEPARTMENT_OTHER)
Admission: EM | Admit: 2024-09-28 | Discharge: 2024-09-28 | Disposition: A | Discharge status: Home / Self Care | Attending: Emergency Medicine | Admitting: Emergency Medicine

## 2024-09-28 ENCOUNTER — Other Ambulatory Visit: Payer: Self-pay

## 2024-09-28 DIAGNOSIS — M545 Low back pain, unspecified: Secondary | ICD-10-CM | POA: Diagnosis present

## 2024-09-28 DIAGNOSIS — M5416 Radiculopathy, lumbar region: Secondary | ICD-10-CM | POA: Diagnosis not present

## 2024-09-28 DIAGNOSIS — Z79899 Other long term (current) drug therapy: Secondary | ICD-10-CM | POA: Insufficient documentation

## 2024-09-28 MED ORDER — MELOXICAM 15 MG PO TABS: 15.0000 mg | ORAL_TABLET | Freq: Every day | ORAL | 0 refills | Status: AC

## 2024-09-28 NOTE — ED Triage Notes (Signed)
 C/o R hip to R lower leg pain x 2 days. Denies injuries.  

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
## 2024-09-28 NOTE — Discharge Instructions (Signed)
 As we discussed, follow up with Dr everitt Cuba, who you saw earlier in the year for further evaluation and management of low back pain and right foot pain felt likely to be lumbar radiculopathy. The MRI of your low back that was previously can be arranged through his office. Take Mobic  daily for the next 15 days to see if this improves your pain. Do not take ibuprofen -related medications while taking Mobic .

## 2024-10-26 ENCOUNTER — Other Ambulatory Visit (HOSPITAL_BASED_OUTPATIENT_CLINIC_OR_DEPARTMENT_OTHER): Payer: Self-pay | Admitting: Family Medicine
